# Patient Record
Sex: Male | Born: 1945 | Race: White | Hispanic: No | Marital: Married | Smoking: Former smoker
Health system: Southern US, Community
[De-identification: ages and names within clinical notes are randomized; demographics above are authoritative.]

## PROBLEM LIST (undated history)

## (undated) DIAGNOSIS — E7800 Pure hypercholesterolemia, unspecified: Secondary | ICD-10-CM

## (undated) DIAGNOSIS — E079 Disorder of thyroid, unspecified: Secondary | ICD-10-CM

## (undated) DIAGNOSIS — Z9889 Other specified postprocedural states: Secondary | ICD-10-CM

## (undated) DIAGNOSIS — I10 Essential (primary) hypertension: Secondary | ICD-10-CM

## (undated) DIAGNOSIS — K449 Diaphragmatic hernia without obstruction or gangrene: Secondary | ICD-10-CM

## (undated) DIAGNOSIS — I219 Acute myocardial infarction, unspecified: Secondary | ICD-10-CM

## (undated) HISTORY — PX: ABDOMINAL AORTIC ANEURYSM REPAIR: SUR1152

---

## 2000-07-08 ENCOUNTER — Encounter: Payer: Self-pay | Admitting: *Deleted

## 2000-07-08 ENCOUNTER — Encounter: Admission: RE | Admit: 2000-07-08 | Discharge: 2000-07-08 | Payer: Self-pay | Admitting: *Deleted

## 2007-06-05 ENCOUNTER — Inpatient Hospital Stay (HOSPITAL_COMMUNITY): Admission: EM | Admit: 2007-06-05 | Discharge: 2007-06-14 | Payer: Self-pay | Admitting: Emergency Medicine

## 2007-06-06 ENCOUNTER — Ambulatory Visit: Payer: Self-pay | Admitting: Thoracic Surgery (Cardiothoracic Vascular Surgery)

## 2007-06-06 ENCOUNTER — Encounter (INDEPENDENT_AMBULATORY_CARE_PROVIDER_SITE_OTHER): Payer: Self-pay | Admitting: *Deleted

## 2007-06-08 ENCOUNTER — Encounter: Payer: Self-pay | Admitting: Thoracic Surgery (Cardiothoracic Vascular Surgery)

## 2007-06-09 ENCOUNTER — Encounter: Payer: Self-pay | Admitting: Thoracic Surgery (Cardiothoracic Vascular Surgery)

## 2007-06-22 ENCOUNTER — Ambulatory Visit: Payer: Self-pay | Admitting: Thoracic Surgery (Cardiothoracic Vascular Surgery)

## 2007-07-01 ENCOUNTER — Inpatient Hospital Stay (HOSPITAL_COMMUNITY)
Admission: RE | Admit: 2007-07-01 | Discharge: 2007-07-08 | Payer: Self-pay | Admitting: Thoracic Surgery (Cardiothoracic Vascular Surgery)

## 2007-07-01 ENCOUNTER — Encounter: Payer: Self-pay | Admitting: Thoracic Surgery (Cardiothoracic Vascular Surgery)

## 2007-07-27 ENCOUNTER — Ambulatory Visit: Payer: Self-pay | Admitting: Thoracic Surgery (Cardiothoracic Vascular Surgery)

## 2007-07-27 ENCOUNTER — Encounter
Admission: RE | Admit: 2007-07-27 | Discharge: 2007-07-27 | Payer: Self-pay | Admitting: Thoracic Surgery (Cardiothoracic Vascular Surgery)

## 2007-08-24 ENCOUNTER — Ambulatory Visit: Payer: Self-pay | Admitting: Thoracic Surgery (Cardiothoracic Vascular Surgery)

## 2008-02-29 ENCOUNTER — Ambulatory Visit: Payer: Self-pay | Admitting: Thoracic Surgery (Cardiothoracic Vascular Surgery)

## 2008-03-28 ENCOUNTER — Ambulatory Visit (HOSPITAL_COMMUNITY): Admission: RE | Admit: 2008-03-28 | Discharge: 2008-03-28 | Payer: Self-pay | Admitting: Cardiology

## 2009-05-22 IMAGING — CR DG CHEST 2V SAME DAY
2 series · 2 of 2 positions shown · non-contrast
Comparison: None.

Exam: Chest, 2 views.

HISTORY: Towards her breath.

[w chest pa]
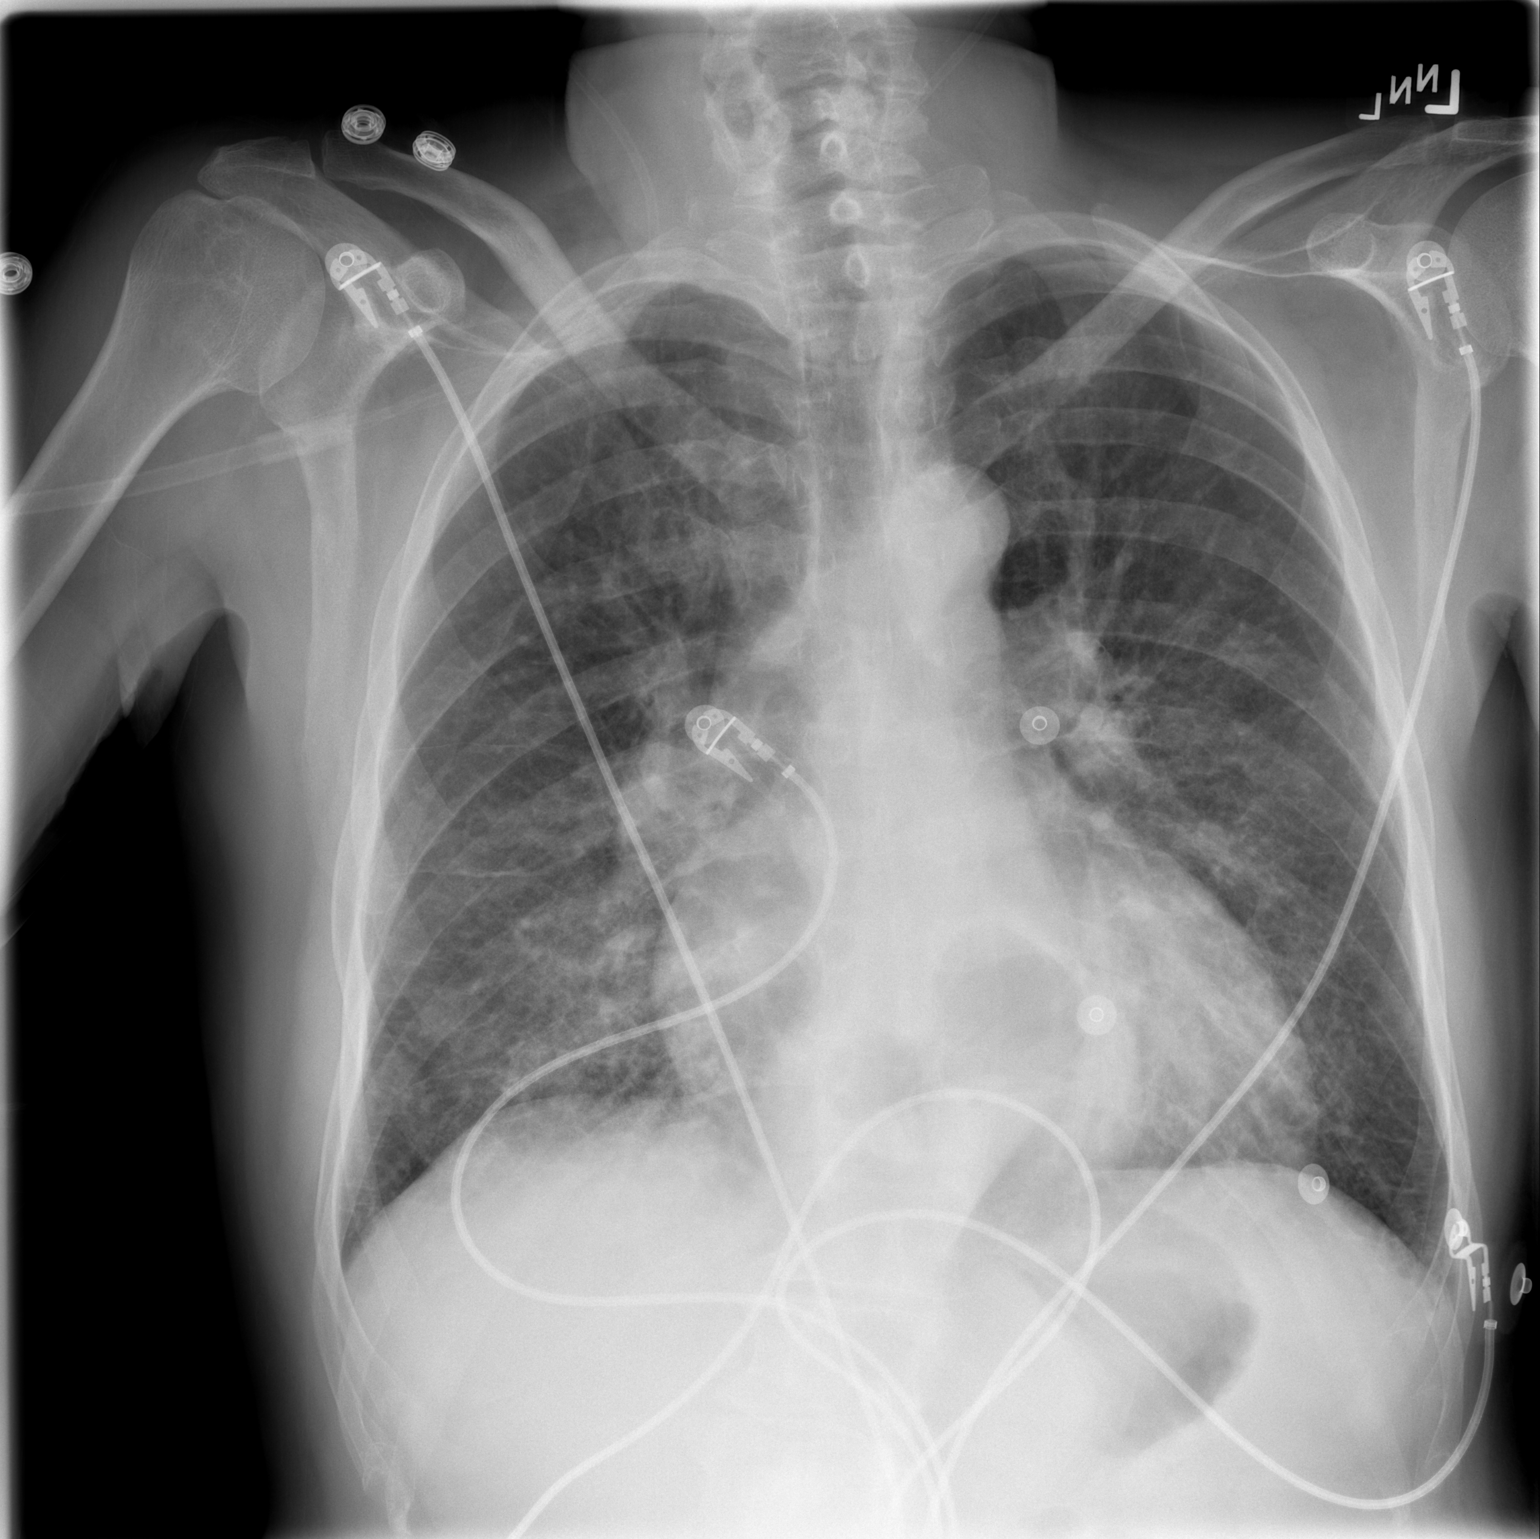

[w chest lat]
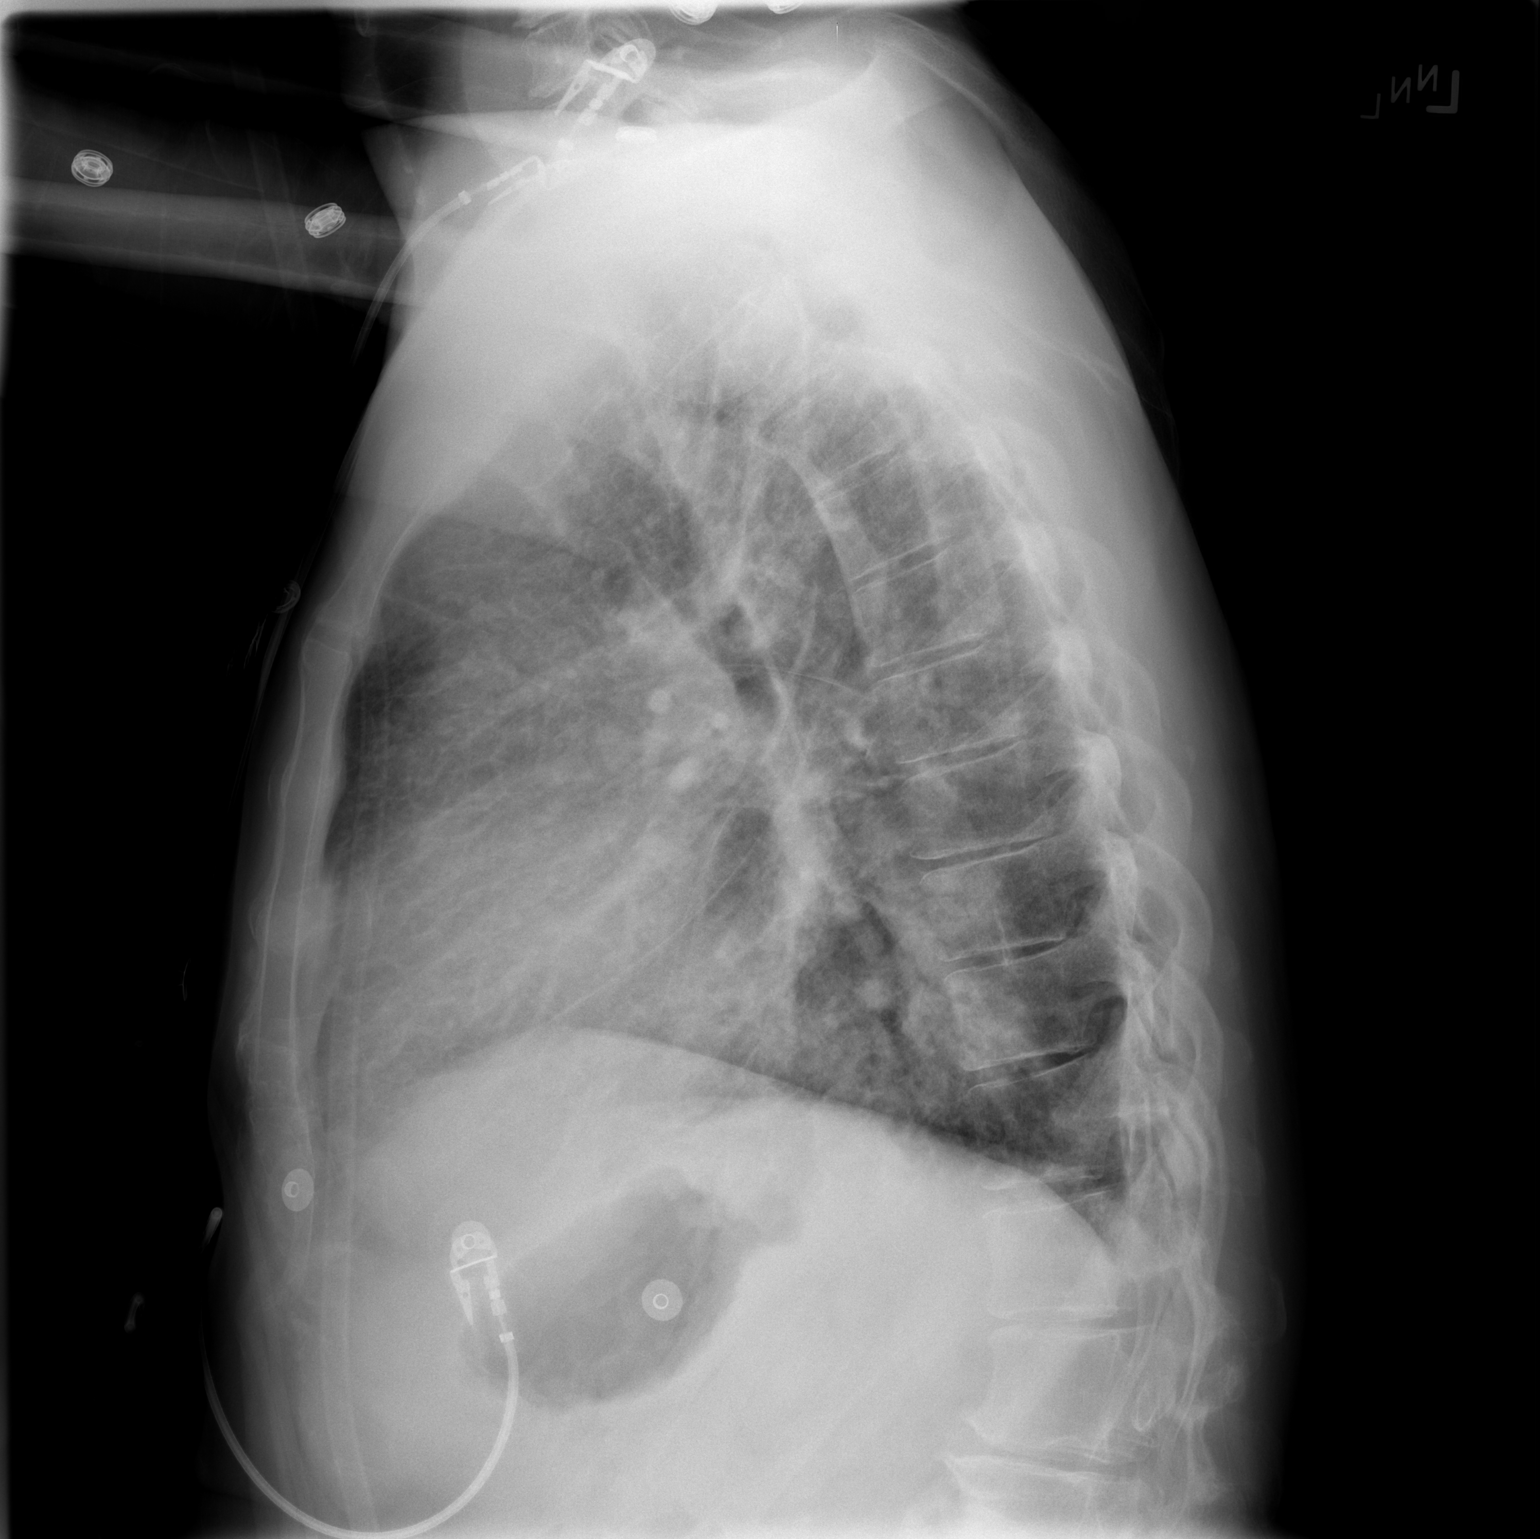

[2 of 2 positions shown; findings below may reference images not displayed]

FINDINGS: Heart size is enlarged.

There is no pleural effusions.

Mild interstitial edema is noted.

Atelectasis is identified at both lung bases.

A large hiatal hernia is identified.
IMPRESSION: 1. Cardiac enlargement and pulmonary edema.
2. Bibasilar atelectasis.
3. Hiatal hernia.

## 2009-05-25 IMAGING — US US ABDOMEN COMPLETE
1 series · 13 of 13 positions shown · non-contrast
Comparison: none

CLINICAL DATA: Anemia.  Assess for cirrhosis. Abnormal liver function tests.
 ABDOMEN ULTRASOUND:
TECHNIQUE: Complete abdominal ultrasound examination was performed including evaluation of the liver, gallbladder, bile ducts, pancreas, kidneys, spleen, IVC, and abdominal aorta.

[Series 1: unknown · 0.32mm/px · 13 of 13 slices shown]
[im 1/13]
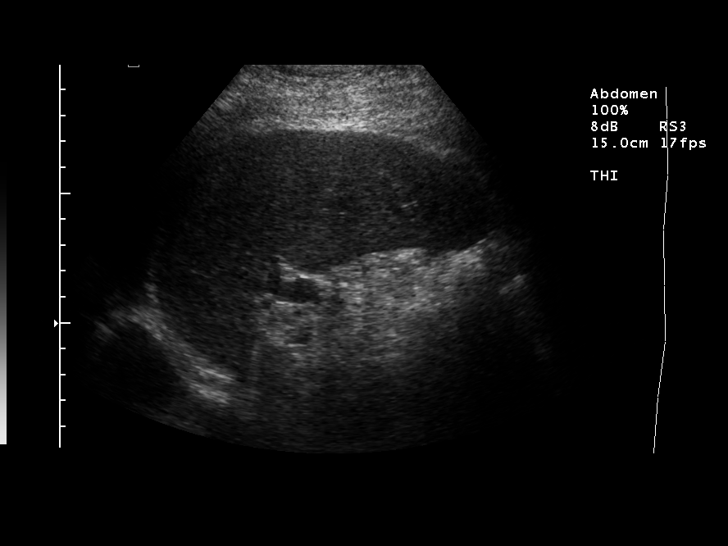
[im 2/13]
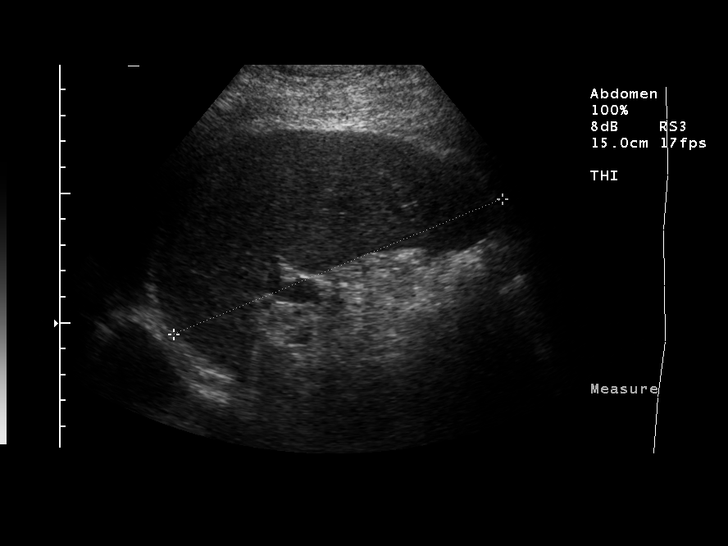
[im 3/13]
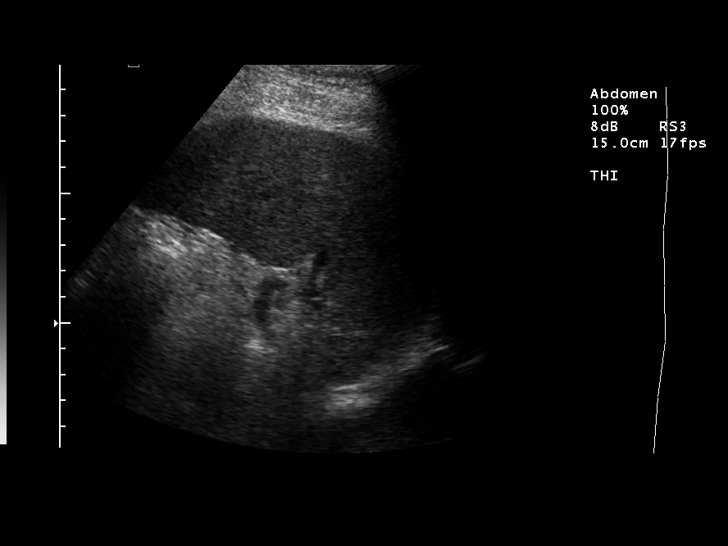
[im 4/13]
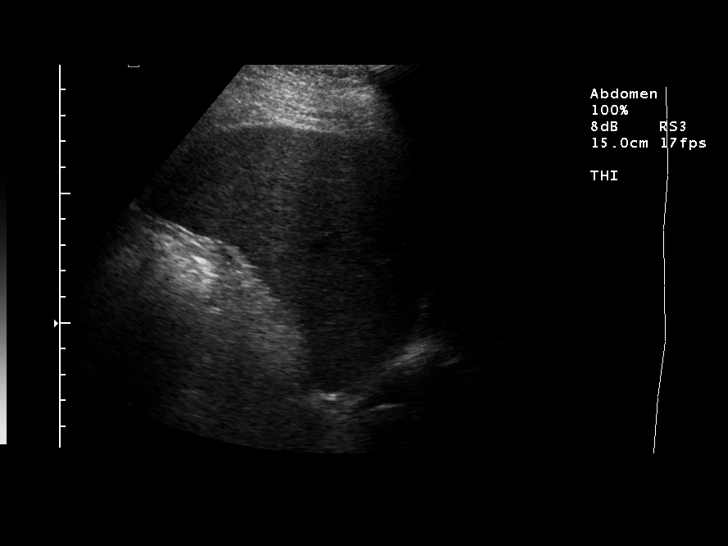
[im 5/13]
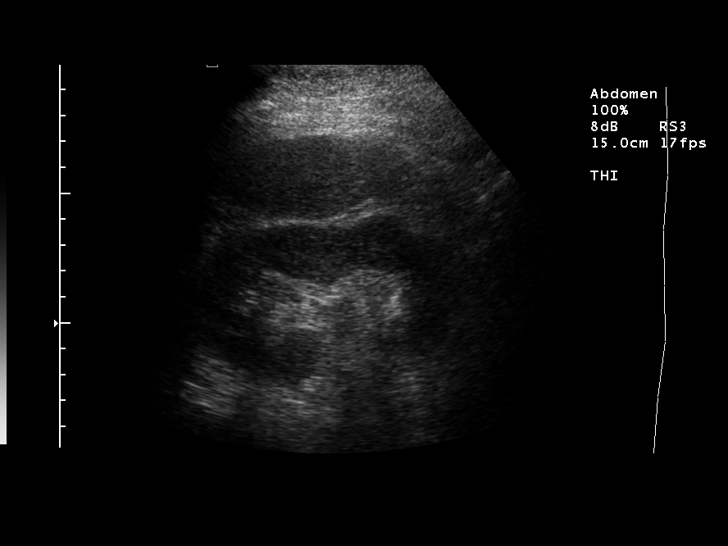
[im 6/13]
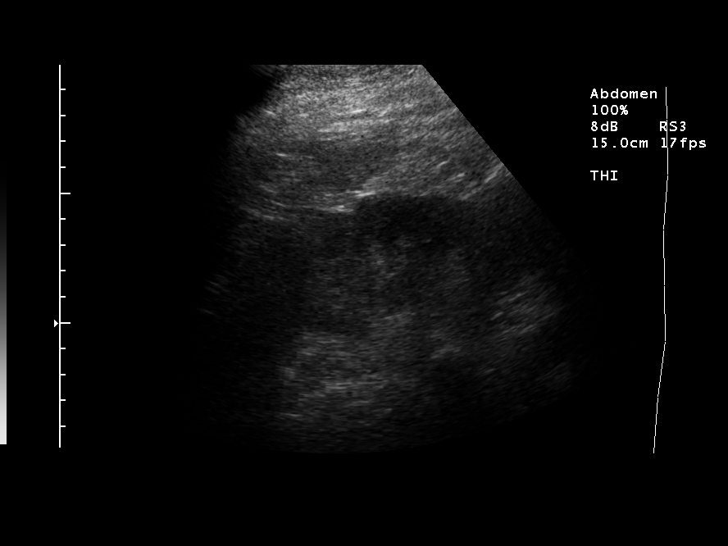
[im 7/13]
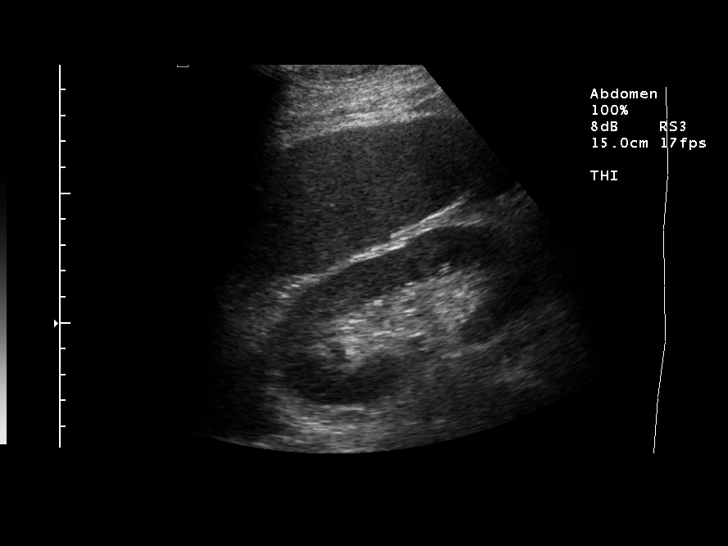
[im 8/13]
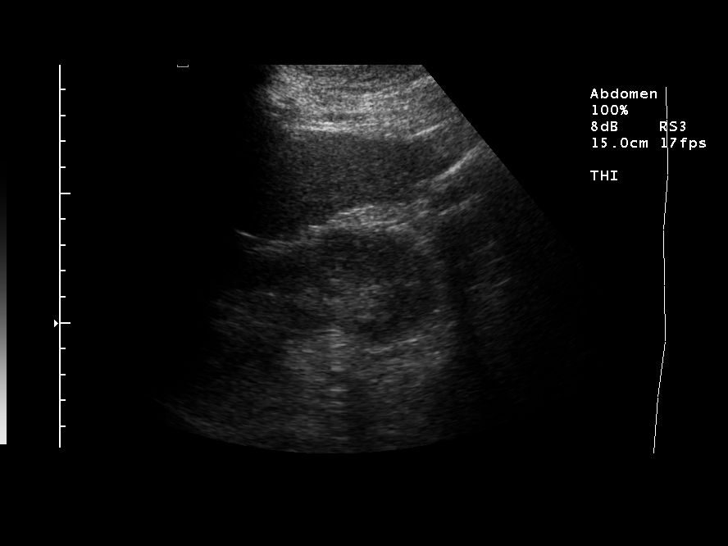
[im 9/13]
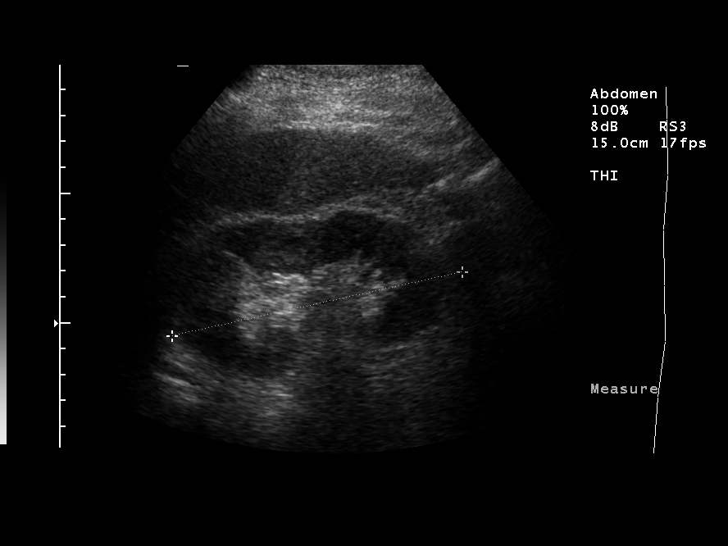
[im 10/13]
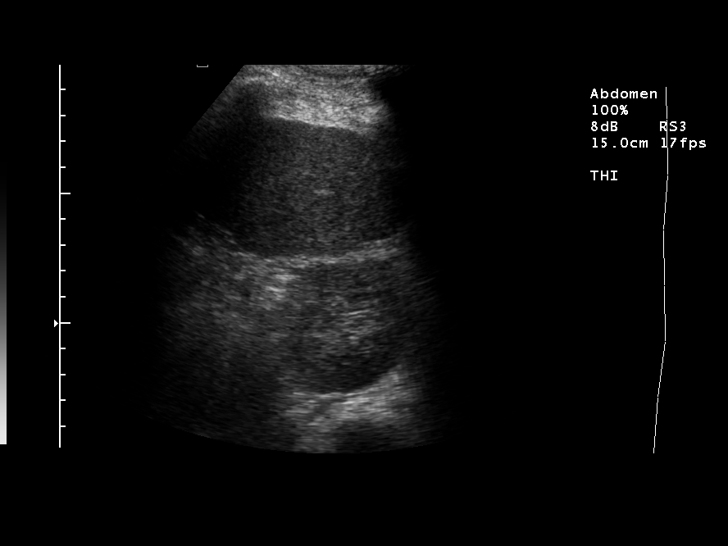
[im 11/13]
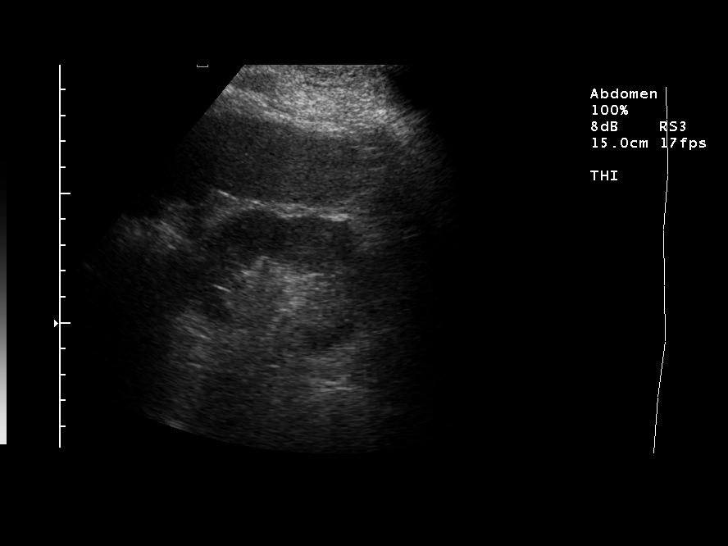
[im 12/13]
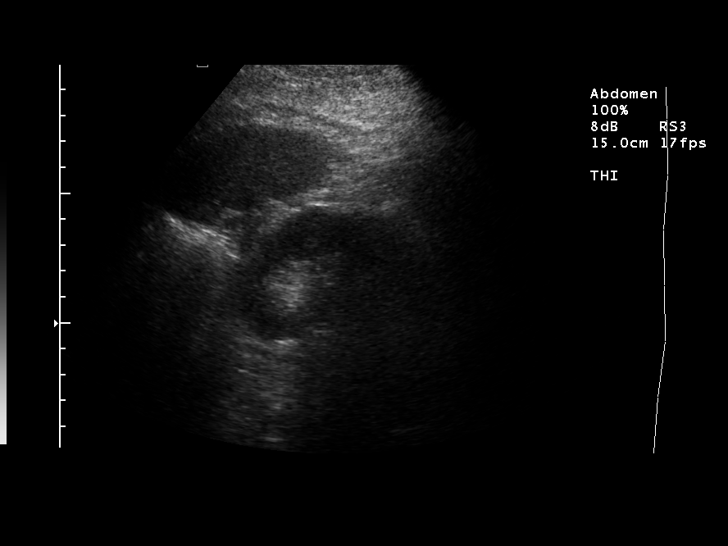
[im 13/13]
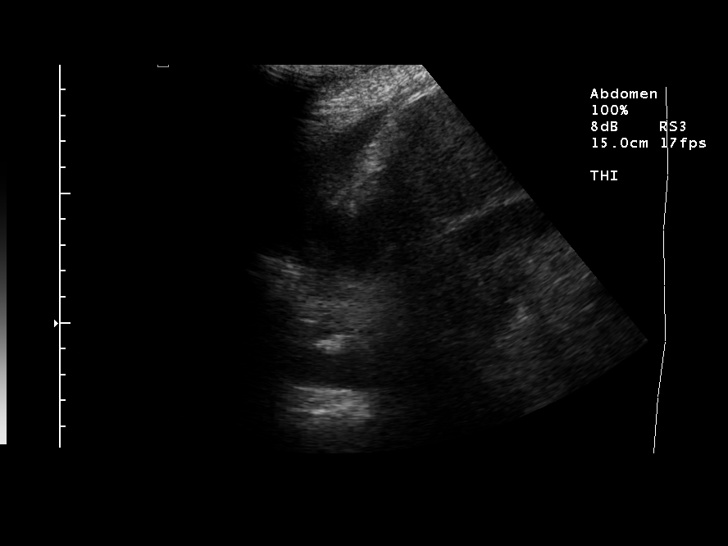

[13 of 13 positions shown; findings below may reference images not displayed]

FINDINGS: The gallbladder is normal without stones or sludge.  The common duct is normal in size at 5 mm.  The spleen is mildly enlarged with a sagittal length of 13.7 cm and a transverse measurement at the hilum of 12 x 6.2 cm.  No focal lesions.  Both kidneys are normal with the right measuring 11.5 cm and the left measuring 11.4 cm.  The aorta shows maximal transverse measurement of 2.6 cm.  There is no ascites.  The patient does have small bilateral pleural effusions.  Blood flow in the portal vein has proper direction.  The hepatic veins are patent.
IMPRESSION: 1. The liver appears normal by sonography.
 2. Mild splenomegaly.
 3. Venous blood flow in normal direction at the present time.

## 2009-06-18 IMAGING — CR DG CHEST 1V PORT
1 series · 1 of 1 positions shown · non-contrast
Comparison: One day prior

CLINICAL DATA: Respiratory distress.

CHEST - 1 VIEW

[view not recorded]
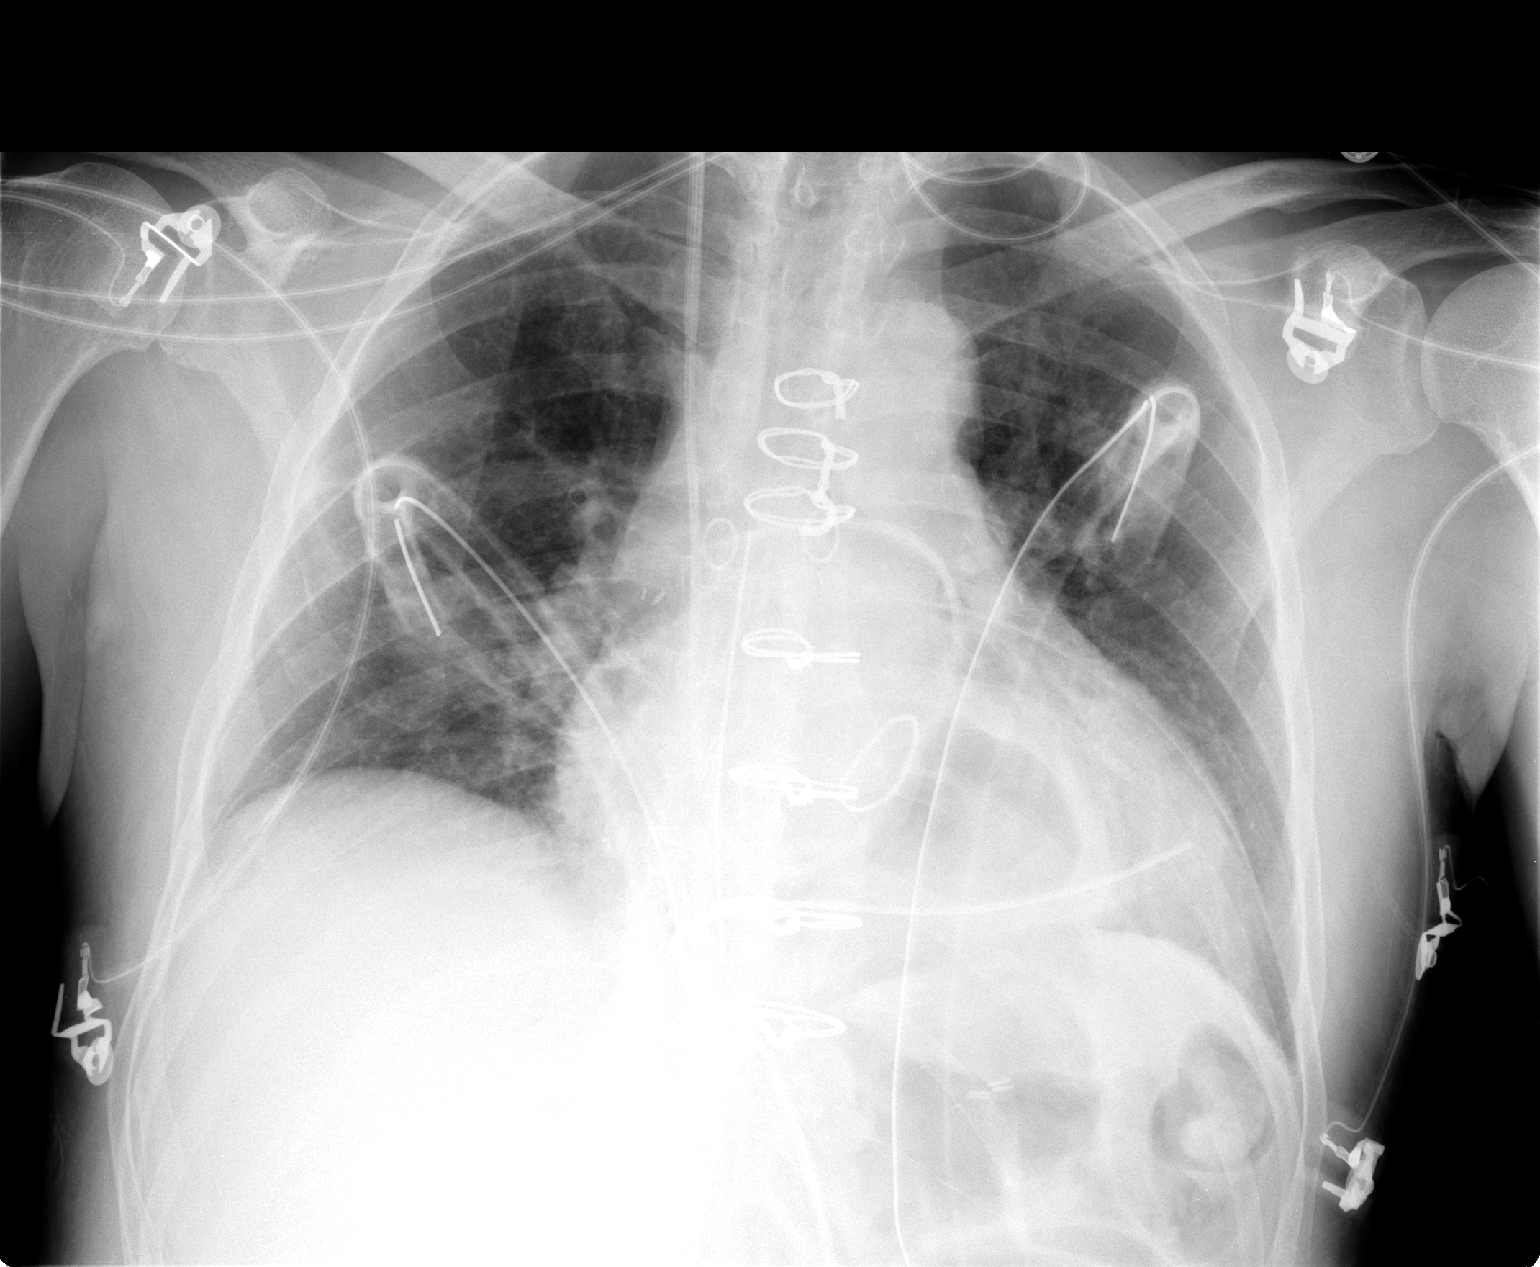

[1 of 1 positions shown; findings below may reference images not displayed]

FINDINGS: Removal of endotracheal and nasogastric tubes. Right IJ Swan-Ganz
catheter unchanged in position. Median sternotomy. Bilateral chest tubes and
mediastinal drain unchanged in position.

Tiny left apical pneumothorax suspected. Minimal layering left-sided pleural
fluid.

Cardiomegaly. Increased patchy atelectasis. A moderate sized hiatal hernia.

IMPRESSION

1. Interval extubation and removal of nasogastric tube. Remainder of support
apparatus appropriately positioned.
2. Tiny left apical pneumothorax suspected with left-sided chest tube in place.
3. Increased patchy atelectasis.

## 2011-02-26 NOTE — Op Note (Signed)
[name], [name] NO.:  [id number]   MEDICAL RECORD NO.:  [id number]          PATIENT TYPE:  INP   LOCATION:  2903                         FACILITY:  [facility]   PHYSICIAN:  Bernette Redbird, M.D.   DATE OF BIRTH:  1946-03-20   DATE OF PROCEDURE:  06/06/2007  DATE OF DISCHARGE:                               OPERATIVE REPORT   PROCEDURE:  Upper endoscopy.   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  He has shown evidence of an acute myocardial infarction  on this admission, characterized by mild bump in cardiac enzymes,  and  associated ST elevation on serial EKGs.  He has a history of significant  aspirin and NSAID exposure.   FINDINGS:  Large hiatal hernia with reflux esophagitis.   PROCEDURE:  The nature, purpose, and risks of the procedure been  discussed with the patient who provided written consent.   SEDATION:  Versed 6 mg IV (no fentanyl).  Following topical pharyngeal  anesthesia with Cetacaine spray.   DESCRIPTION OF PROCEDURE:  The procedure was done at the bedside in the  ICU.   With the patient in left lateral decubitus position, the Pentax video  endoscope was passed under direct vision.  The vocal cords looked  normal.  The esophagus was entered; and had inflammatory changes  beginning at approximately 25 cm.  Proximal to this, the esophagus was  normal; but distally, there were linear patches of eroded mucosa very  consistent with reflux esophagitis, as well as some dimpling of the  mucosa consistent with scarring from previous inflammatory conditions.  There were no deep ulcerations, and no stigma of hemorrhage.  No tumor  or varices were seen.  There may have been some mild esophageal stenosis  or narrowing associated with this inflammatory change; but there was no  resistance to passage of the 10-mm endoscope.   The  gastroesophageal junction was located at between 32 and 35 cm and  below this was a very large hiatal hernia extending down to the  diaphragm at about 43 cm, thus constituting roughly a 7-9 cm in length  hiatal hernia. In addition, the hiatal hernia was very broad in  transverse dimension, probably at least 15 cm.  There was no definite  para-esophageal component, but the hiatal hernia was large enough that  it did appear, that a small portion of it may have actually gone  alongside the esophagus.   The diaphragmatic hiatus was very patulous.   The stomach contained no blood or coffee-grounds material.  The gastric  mucosa was entirely normal despite the patient's history of aspirin and  NSAID exposure.  Specifically no erosions or ulcers were seen; and there  was no evidence of polyps, masses, cancer, or vascular ectasia.  A  retroflexed view confirmed the patulous character of the diaphragmatic  hiatus, as well as the capacious size of the hiatal hernia.  The  pylorus, duodenal bulb, and second duodenum looked normal.   The scope was removed from  the patient.  I elected not to take biopsies  in the event that potent anticoagulation might be needed during a  percutaneous coronary intervention later today, as is being contemplated  by Dr. Sharyn Lull.  The patient tolerated the procedure well and there no  apparent complications.   IMPRESSION:  1. No active bleeding or blood in the stomach.  2. Very large hiatal hernia as described above.  This readily accounts      for the patient's anemia.  3. Erosive reflux esophagitis without stigma of hemorrhage, thus, the      patient is thought to be at low risk for inducible GI bleeding in      the event of anticoagulation.  4. No NSAID gastritis or ulcers.  5. No evidence of malignancy.   PLAN:  1. Okay for antiplatelet therapy or anticoagulation as may be needed      by Dr. Sharyn Lull.  2. Increase PPI therapy.  3. The patient will need  follow up endoscopy to confirm healing of the      esophageal mucosa, plus colonoscopic evaluation, at some point in      the near future, probably about 2 months from now.  4. He would probably benefit from eventual surgical repair of his      hiatal hernia, since this is probably where the anemia came from.           ______________________________  Bernette Redbird, M.D.     RB/MEDQ  D:  06/06/2007  T:  06/06/2007  Job:  [id number]   cc:   Eduardo Osier. Sharyn Lull, M.D.

## 2011-02-26 NOTE — Assessment & Plan Note (Signed)
OFFICE VISIT   [name], [name]  DOB:  [birth year]                                        Feb 29, 2008  CHART #:  [id number]   HISTORY OF PRESENT ILLNESS:  [name] returns for late followup status  post coronary artery bypass grafting x3 and mitral valve repair on  July 01, 2007.  He was last seen here in the office August 24, 2007.  Since then, he has continued to do quite well.  He denies any  shortness of breath.  He denies any significant chest pain.  His  activity level is good, and he feels much better than he did prior to  surgery.  He does report that he still has a funny numb feeling along  the left anterior chest wall.  He has not had any tachy-palpitations.  He has not had any dizzy spells.  He has no symptoms of congestive heart  failure.  The remainder of his review of systems is unrevealing.  The  remainder of his past medical history is unchanged.   CURRENT MEDICATIONS:  1. Amiodarone 2 mg daily.  2. Coumadin as directed.  3. Iron sulfate.  4. Lopressor 50 mg daily.  5. Pepcid 40 mg twice daily.  6. Synthroid 0.05 mg daily.  7. Niacin 500 mg twice daily.  8. Ambien as needed for sleep.  9. Lecithin 200 mg three times daily.  10.Aspirin 81 mg daily.  11.Altace 10 mg daily.  12.Tekturna 150 mg daily.  13.Lasix 20 mg daily.  14.Protonix 40 mg daily.  15.Lipitor 10 mg daily.   PHYSICAL EXAM:  Notable for a well-appearing thin male with blood  pressure 161/110, pulse 88 and regular.  Oxygen saturation is 98% on  room air.  Examination of the chest reveals a well-healed median  sternotomy scar.  The breath sounds are clear to auscultation and  symmetrical bilaterally.  Cardiovascular:  Regular rate and rhythm.  No  murmurs, rubs or gallops are noted.  Abdomen:  Soft and nontender.  Extremities:  Warm and well-perfused.  There is no lower extremity  edema.  The remainder of his physical exam is unrevealing.   IMPRESSION:  [name]  seems to doing quite well.  His blood pressure  remains inadequately controlled.  He has not had any symptoms to suggest  continued ongoing arrhythmias.  He remains on both amiodarone and  Coumadin.   PLAN:  I have instructed [name] to stop taking amiodarone.  I think he  could come off of Coumadin as well, presuming that his rhythm remains  stable.  He plans to see Dr. Sharyn Lull later this month, and I have  suggested that he should make sure that Dr. Sharyn Lull is comfortable  stopping his Coumadin when he sees him at that time.  We have not made  any changes in his blood pressure medications at this time, and he  understands that this needs to be addressed and further adjusted as  needed.  I would recommend a followup echocardiogram at some point as  well.  Overall, he seems to be doing very well with respect to his  previous heart surgery, and he has no residual surgical issues at this  juncture.  Mild numbness and altered sensational along the left anterior  chest wall is typical for patients who have  had recent bypass surgery.  In the future, he will call and return to see [place] here at [facility] only should further problems or difficulties  arise.   [name]. Cornelius Moras, M.D.  Electronically Signed   CHO/MEDQ  D:  02/29/2008  T:  02/29/2008  Job:  [id number]   cc:   Eduardo Osier. Sharyn Lull, M.D.  Ralene Ok, M.D.  Bernette Redbird, M.D.

## 2011-02-26 NOTE — Assessment & Plan Note (Signed)
OFFICE VISIT   [name], [name]  DOB:  1946-06-14                                        August 24, 2007  CHART #:  [id number]   HISTORY OF PRESENT ILLNESS:  The patient returns for further followup  status post coronary artery bypass grafting x3 and mitral valve repair  on 07/01/2007.  He was last seen here in the office by Dr. Dorris Fetch  on 07/27/2007.  Since then, he has continued to do well.  He denies any  shortness of breath, and in fact he notes that his exercise tolerance is  now better with respect to breathing than it was prior to his surgery.  He is not having tachy palpitations or dizzy spells.  He has minimal  soreness in his chest.  Overall, he seems to be getting along quite  well.  His medications remain unchanged from previously, other than the  fact that his dose of Coumadin continues to be adjusted, now 4 mg daily.   PHYSICAL EXAMINATION:  Notable for a well-appearing male, with blood  pressure 144/89, pulse 62, oxygen saturation 97% on room air.  Examination of the chest reveals a median sternotomy incision that is  healing nicely.  The sternum is stable on palpation.  Breath sounds are  clear to auscultation and symmetrical bilaterally.  No wheezes or  rhonchi are demonstrated.  Cardiovascular exam is notable for a regular  rate and rhythm.  No murmurs, rubs, or gallops are noted.  The abdomen  is soft and nontender.  The extremities are warm and well perfused.  The  small incision from endoscopic vein harvest has healed nicely.  There is  no lower extremity edema.   IMPRESSION:  The patient continues to improve, now nearly 2 months  status post coronary artery bypass grafting x3 and a mitral valve repair  for severe 3-vessel coronary artery disease with ischemic mitral  regurgitation.  The patient seems to be doing well.  There are no signs  or symptoms to suggest any further atrial arrhythmias.   PLAN:  I have instructed the  patient to cut his dose of amiodarone back  to 200 mg daily.  He plans to see Dr. Sharyn Lull for further followup in  early 10/2007.  Perhaps at that time he could come off of Coumadin or  amiodarone, depending on how he is progressing.  I have cautioned him to  avoid any heavy lifting or strenuous use of his arms or shoulders.  I do  think he could probably go back to work at some point within the next  month, particularly if he can go back and work limited hours initially  and gradually increase from there.  I have also suggested that he might  benefit from the cardiac rehab program.   [name]. Cornelius Moras, M.D.  Electronically Signed   CHO/MEDQ  D:  08/24/2007  T:  08/25/2007  Job:  [id number]   cc:   Eduardo Osier. Sharyn Lull, M.D.  Bernette Redbird, M.D.

## 2011-02-26 NOTE — Assessment & Plan Note (Signed)
OFFICE VISIT   [name], [name]  DOB:  Dec 07, 1945                                        June 22, 2007  CHART #:  [id number]   HISTORY OF PRESENT ILLNESS:  The patient returns for further follow up  related to recently discovered 3-vessel coronary artery disease, status  post acute myocardial infarction.  He was originally seen in  consultation on 06/06/2007.   The patient originally presented with profound symptomatic anemia with a  hemoglobin of 4.1 and Hemoccult-positive stools.  He was seen in  consultation by Dr. Bernette Redbird and was found to have a giant sliding  hiatal hernia that was felt to be the cause of chronic GI blood loss  anemia.  He was transfused multiple units of blood products.  He did  suffer an acute myocardial infarction and cardiac catheterization  performed by Dr. Sharyn Lull demonstrated 3-vessel coronary artery disease  with moderate left ventricular dysfunction.  The patient stabilized  quite nicely with medical therapy and has done extremely well since  then.  His hemoglobin has remained stable.  He had a transient episode  of paroxysmal atrial fibrillation for which he was started on  amiodarone, although this was very brief and he was discharged home in  sinus rhythm.  He returns to the office for further followup today.   REVIEW OF SYSTEMS:  The patient reports feeling well.  He has not had  any symptoms of chest pain, chest tightness, chest pressure or shortness  of breath.  He has not had any tachy-palpitations.  He is eating well.  He states that he feels better than he has in months, if not longer than  that.  His family notes that he looks terrific compared to how he had  been looking for months prior to his recent hospitalization.  He has not  had any grossly bloody stools. He has no abdominal pain or complaints.  The remainder of his review of systems is unchanged from previously.   PAST MEDICAL HISTORY:  The  remainder of his past medical history is  unchanged.   CURRENT MEDICATIONS:  1. Iron sulfate 325 mg 3 times daily.  2. Lopressor 50 mg once daily.  3. Pepcid 40 mg twice daily.  4. Synthroid 50 mcg once daily.  5. Niacin SR 500 mg daily.  6. Ambien 5 mg at bedtime as needed for sleep.  7. Amiodarone 100 mg twice daily.  8. Megavitamin 1 tablet daily.  9. Lecithin SR 200 mg 3 times daily.  10.Aspirin 81 mg daily.   ALLERGIES:  None.   PHYSICAL EXAMINATION:  General:  The patient is a well-appearing male  who appears his stated age, in no acute distress.  Vital signs:  Blood  pressure 122/80, pulse 70 and regular, oxygen saturation 99% on room  air.  HEENT:  Within normal limits.  Neck:  Supple.  There is no  cervical or supraclavicular lymphadenopathy.  There is no jugular venous  distention, no carotid bruits are noted.  Lungs:  Auscultation of the  chest demonstrates clear breath sounds which are symmetrical  bilaterally.  No wheezes or rhonchi are noted.  Cardiovascular:  Reveals  regular rate and rhythm.  No murmurs, rubs, or gallops are appreciated.  Abdomen:  Soft, nontender.  There are no palpable masses.  Bowel sounds  are present.  Extremities:  Warm and well perfused.  There is no lower  extremity edema.  Distal pulses are palpable in both lower legs at the  ankle.  There is no venous insufficiency.  Rectal/GU:  Exams are both  deferred.   DIAGNOSTIC TESTS:  Complete blood count obtained 06/19/2007 through  [facility] is notable for hemoglobin 11.7, hematocrit  40.9%, white blood count 7100, platelet count 481,000.   IMPRESSION:  Severe 3-vessel coronary artery disease with recent  myocardial infarction that occurred in the setting of profound anemia  related to chronic gastrointestinal blood loss with a large sliding type  hiatal hernia.  The patient remains quite stable at present and his  hemoglobin and hematocrit has continued to increase  following hospital  discharge.  I believe it would make sense to go ahead and proceed with  elective surgical revascularization for definitive treatment of his  underlying coronary artery disease.   PLAN:  I have discussed the indications, risks, and potential benefits  of surgery with the patient here in the office today.  Alternative  treatment  strategies have been discussed.  He understands and accepts  all associated risks of surgery, including, but not limited to, risk of  death, stroke, myocardial infarction, congestive heart failure,  respiratory failure, pneumonia, bleeding requiring blood transfusion,  arrhythmia, infection, and recurrent coronary artery disease.  All of  his questions have been addressed.  We tentatively plan to proceed with  surgery on Wednesday, July 01, 2007.   [name]. Cornelius Moras, M.D.  Electronically Signed   CHO/MEDQ  D:  06/22/2007  T:  06/23/2007  Job:  [id number]   cc:   Bernette Redbird, M.D.  Eduardo Osier. Sharyn Lull, M.D.

## 2011-02-26 NOTE — Consult Note (Signed)
[name], [name] NO.:  [id number]   MEDICAL RECORD NO.:  [id number]          PATIENT TYPE:  INP   LOCATION:  2903                         FACILITY:  [facility]   PHYSICIAN:  [name]. Cornelius Moras, M.D. DATE OF BIRTH:  [birth year]   DATE OF CONSULTATION:  06/06/2007  DATE OF DISCHARGE:                                 CONSULTATION   REQUESTING PHYSICIAN:  Mohan N. Sharyn Lull, M.D.   REASON FOR CONSULTATION:  Severe three-vessel coronary artery disease  with acute myocardial infarction.   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  The patient has not seen a  physician in quite some time; although, in the past he has seen Dr.  Karilyn Cota .  He apparently has known history of hypertension and  hyperlipidemia; although, he has not been regularly on any medications  recently.  The patient and his family state that he had been sort of  slowly declining over quite some time, gradually becoming weak.  The  patient also reports a several-month history of progressive symptoms of  exertional shortness of breath.  He denies ever having any symptoms of  substernal chest pain, chest tightness, chest pressure either with  activity or at rest.  Over the last week he developed severe generalized  weakness, and on the morning of August 22, he developed severe weakness  and shortness of breath.  He presented to the emergency department where  baseline laboratory data were notable for a hemoglobin of 4.1 and  Hemoccult positive stool.  The patient denied any known history of  grossly heme positive stool or hematemesis.  He was admitted to the  hospital and evaluated by Dr. Bernette Redbird.  He was transfused a total  of four units of blood overnight last night and two additional units of  blood today.  He has remained hemodynamically stable throughout.  He has  never had any chest pain.  He underwent upper GI endoscopy  Dr. Matthias Hughs.  He was found to have a very large hiatal hernia with erosive  esophagitis.  There is no sign of ongoing GI bleeding.  Admission lab  work were also notable for abnormal cardiac enzymes, consistent with  acute myocardial infarction.  Electrocardiogram at the time of admission  was notable for ST-segment depression somewhat diffusely across the  precordial leads, and repeat electrocardiogram this morning revealed  some ST-segment elevation in lead III.  The patient was evaluated by Dr.  Sharyn Lull and taken to the cardiac cath lab.  He was found to have three-  vessel coronary artery disease with 100% occlusion of the mid right  coronary artery and left-to-right collateral filling of the distal right  coronary artery and posterior descending coronary artery.  There is also  70% stenosis of the mid left anterior descending coronary artery arising  at takeoff of a large diagonal branch.  There is 70% stenosis of the  left anterior descending coronary artery further distally as well.  The  left circumflex coronary artery is small.  Left ventricular function is  moderately reduced with inferior wall akinesis.  Cardiac surgical  consultation was requested, and no attempt for percutaneous coronary  intervention of the right coronary artery was made.   REVIEW OF SYSTEMS:  At present the patient is alert and oriented and  entirely cooperative.  He denies any symptoms of chest pain, chest  tightness, or chest pressure at all in the recent past.  The patient  denies any shortness of breath presently.  He reports that his appetite  has been marginal, and he has not been eating well for quite some time.  He has lost some weight.  He is unsure how much.  He describes some  stable exertional shortness of breath.  He denies resting shortness of  breath, PND, orthopnea, or lower extremity edema.  He reports occasional  difficulty swallowing, but this does not seem to be much of an issue.  He  has longstanding symptoms of reflux.  He denies any knowledge of  hematochezia, hematemesis, or melena.  The patient does report that, in  the past, he has had occasional hemorrhoids with some small amounts of  bright red blood, although, none recently.  The patient denies arthritis  or arthralgias of significance other than mild arthritis in his back.  The patient denies any transient monocular blindness, transient numbness  or weakness involving either upper or lower extremity.  GENITOURINARY:  Negative.  The patient denies urinary urgency or frequency.  The patient  denies fevers or chills.  The remainder of his review of systems is  unremarkable.   PAST MEDICAL HISTORY:  1. Hypertension.  2. Hyperlipidemia.  3. Tobacco use.  4. Remote history alcohol abuse.   MEDICATIONS PRIOR TO ADMISSION:  1. Aspirin 81 mg daily.  2. Ibuprofen 400 mg twice daily.  3. Excedrin as needed.  4. The patient also states that occasionally he took lisinopril and      Lipitor; although, this seems to have been inconsistently.   DRUG ALLERGIES:  NONE KNOWN.   SOCIAL HISTORY:  The patient is married with three children.  He works  as a Teacher, early years/pre.  He does not have a very active physical lifestyle.   FAMILY HISTORY:  Noncontributory.   PHYSICAL EXAM:  GENERAL:  The patient is thin white male who appears his  stated age, in no acute distress.  HEENT:  Exam is grossly unrevealing.  NECK:  The neck is supple.  There is no cervical or supraclavicular  lymphadenopathy.  There is no jugular venous distention.  No carotid  bruits are noted.  CHEST:  Auscultation of the chest demonstrates clear breath sounds with  a few inspiratory crackles at the lung bases.  No wheezes or rhonchi are  noted.  CARDIOVASCULAR:  Exam reveals regular rate and rhythm.  No murmurs,  rubs, or gallops are noted.  ABDOMEN:  Soft and nontender.  There are no palpable masses.  EXTREMITIES:  Warm and adequately perfused.  There  is no lower extremity  edema.  Distal pulses are palpable in the dorsalis pedis position.  There is no sign of venous insufficiency.  RECTAL/GU:  Rectal and GU exams are both deferred.  NEUROLOGIC:  Examination is grossly nonfocal and symmetrical throughout.   DIAGNOSTIC TESTS:  Cardiac catheterization performed by Dr. Sharyn Lull is  reviewed.  This demonstrates three-vessel coronary artery disease.  There is 70% stenosis of the left anterior descending coronary artery  arising at the takeoff of the diagonal branch.  There is 70% tubular  stenosis of the  left anterior descending coronary artery further towards  the apex.  The diagonal branch is large.  The left circumflex coronary  artery and its branches are very small.  There is 100% occlusion of the  mid right coronary artery with left-to-right collateral filling of the  posterior descending coronary artery.  There is moderate left  ventricular dysfunction with inferior wall akinesis and mild mitral  regurgitation.   IMPRESSION:  Severe three-vessel coronary artery disease status post  acute myocardial infarction.  It remains entirely unclear when [name]  suffered his most recent myocardial infarction, and it is possible he  has had one in the past, given the fact that there is some collateral  filling of the posterior descending coronary artery on cardiac  catheterization.  His current event occurred in the setting of profound  anemia.  He has never had any chest pain at all.  He has no other  symptoms or signs of ongoing myocardial ischemia; although, admission  electrocardiograms and repeat electrocardiograms are notable for  ischemic changes, and his cardiac enzymes were consistent with a  significant myocardial infarction.  However, in the setting of his newly  discovered profound anemia related to presumed longstanding GI bleeding,  I do not feel that [name] would best be treated with emergent surgical  revascularization.   The amount of benefit to proceeding with surgery at  this point is questionable at best given the fact that it is very  unclear how much myocardium in the inferior wall remains viable and  whether or not the GI bleeding issue has been adequately addressed.  If  urgent revascularization is felt to be indicated, I would proceed with  percutaneous coronary intervention of the right coronary artery and  consider surgical revascularization at a delayed point in time down the  road after he recovers from all these events.  Alternatively, if his  ischemia is felt to be more chronic, I would favor simply treating him  medically for the time being and allow him to recover from his event.  I  would transfuse to keep his hemoglobin greater than 10 for the short  term while he is recovering from his myocardial infarction and continue  to monitor closely for signs of ongoing bleeding.  We will continue to  follow along and assist with decision-making process, but I do not feel  that proceeding with surgery at this time would be in [name]'s best  interest.      [name]. Cornelius Moras, M.D.  Electronically Signed     CHO/MEDQ  D:  06/06/2007  T:  06/07/2007  Job:  [id number]   cc:   Eduardo Osier. Sharyn Lull, M.D.  Bernette Redbird, M.D.

## 2011-02-26 NOTE — Op Note (Signed)
NAMELAVONNE, CASS NO.:  [id number]   MEDICAL RECORD NO.:  [id number]          PATIENT TYPE:  INP   LOCATION:  2315                         FACILITY:  [facility]   PHYSICIAN:  [name]. Cornelius Moras, M.D. DATE OF BIRTH:  09-Nov-1945   DATE OF PROCEDURE:  07/01/2007  DATE OF DISCHARGE:                               OPERATIVE REPORT   PREOPERATIVE DIAGNOSIS:  Severe three vessel coronary artery disease  with moderate left ventricular dysfunction.   POSTOPERATIVE DIAGNOSIS:  Severe three vessel coronary artery disease  with moderate left ventricular dysfunction and severe ischemic mitral  regurgitation.   PROCEDURE:  Median sternotomy for coronary artery bypass grafting x3  (left internal mammary artery to distal left anterior descending  coronary artery, saphenous vein graft to first diagonal branch,  saphenous vein graft to posterior descending coronary artery, endoscopic  saphenous vein harvest from right thigh) and mitral valve repair (26 mm  Edwards ETlogix ring annuloplasty).   SURGEON:  [name]. Cornelius Moras, M.D.   ASSISTANT:  Jacklynn Bue, SA   ANESTHESIA:  General.   BRIEF CLINICAL NOTE:  The patient is a 65 year old male who originally  presented in August 2008 with profound generalized weakness and  exertional shortness of breath.  He was admitted through the emergency  department where he was noted to have profound iron deficiency anemia  with a hemoglobin 4.1 and hemoccult positive stool.  There was no sign  of active GI bleeding.  The patient was evaluated by Dr. Bernette Redbird  and underwent upper GI endoscopy.  The patient was found to have a large  hiatal hernia with evidence of erosive gastritis and esophagitis, but no  sign of ongoing GI blood loss.  The patient was transfused multiple  units of blood products with appropriate increase in his hemoglobin and  hematocrit.  He has had no further signs of GI bleeding.  During his  initial presentation, the  patient did develop EKG changes suggestive of  acute myocardial infarction.  He underwent cardiac catheterization by  Dr. Sharyn Lull.  He was found to have severe three vessel coronary artery  disease with moderate left ventricular dysfunction.  A full consultation  has been dictated previously.  The patient recovered from his acute  event and has done remarkably well from a clinical standpoint.  He now  returns for elective surgical revascularization.  The patient has been  counseled at length regarding the indications, risks, and potential  benefits of surgery.  Alternative treatment strategies have been  discussed.  He understands and accepts all potential associated risks of  surgery and desires to proceed as described.   OPERATIVE FINDINGS:  1. Moderate left ventricular dysfunction with inferior and posterior      basal wall akinesis, ejection fraction estimated 30%.  2. Severe (3+) mitral regurgitation, functional type III-B.  3. Good quality left internal mammary artery and saphenous vein      conduit for grafting.  4. Good quality target vessels for grafting.  5. No residual mitral regurgitation following mitral valve repair.   OPERATIVE NOTE IN DETAIL:  The patient is  brought to the operating room  on the above mentioned date and central monitoring was established by  the anesthesia service under the care and direction of Dr. Adonis Huguenin.  Specifically, a Swan-Ganz catheter is placed through the right internal  jugular approach.  A radial arterial line is placed.  Intravenous  antibiotics were administered.  Following induction with general  endotracheal anesthesia, a Foley catheter is placed.  The patient's  chest, abdomen, both groins, and both lower extremities are prepared and  draped in a sterile manner.   Baseline pulmonary artery pressures were notably moderately elevated.  Baseline transesophageal echocardiogram was performed by Dr. Krista Blue.  This demonstrates moderate  left ventricular dysfunction with ejection  fraction estimated at 30%.  There is akinesis of the high inferior wall  and posterolateral wall.  There is severe (3+) mitral regurgitation with  classical functional type III-B regurgitation with a broad central jet  that fills the majority of the left atrium.  There is no flow reversal  in the pulmonary veins.  No other abnormalities are noted.   A median sternotomy incision is performed and the left internal mammary  artery is dissected from the chest wall and prepared for bypass  grafting.  The left internal mammary artery is notably a good quality  conduit.  Simultaneously, saphenous vein was obtained from the patient's  right thigh using endoscopic vein harvest technique.  The saphenous vein  is notably a good quality conduit.  After the saphenous vein is removed,  the small incision in the right lower extremity is closed in multiple  layers with running absorbable suture.  The patient is heparinized  systemically and the left internal mammary artery transected distally.  The left internal mammary artery is noted to have excellent flow.   The pericardium was opened.  The ascending aorta is normal in  appearance.  The ascending aorta is cannulated for cardiopulmonary  bypass.  A venous cannula is placed directly in the superior vena cava.  A second venous cannula is placed low in the right atrium with the tip  extending down the inferior vena cava.  A retrograde cardioplegia  catheter is placed through the right atrium into the coronary sinus.  Adequate heparinization is verified.  Cardiopulmonary bypass is begun  and the surface of the heart was inspected.  There is scarring in the  inferior wall consistent with transmural myocardial infarction.  Distal  target sites are selected for coronary bypass grafting.  A temperature  probe is placed in the left ventricular septum.  A cardioplegic catheter  is placed in the ascending aorta.    The patient is allowed to cool passively to 32 degrees systemic  temperature.  The aortic crossclamp was applied and cold blood  cardioplegia is administered initially in antegrade fashion through the  aortic root.  Iced saline slush was applied for topical hypothermia.  The initial cardioplegic arrest and myocardial cooling are felt to be  excellent.  Supplemental cardioplegia is administered retrograde through  the coronary sinus catheter.  Repeat doses of cardioplegia are  administered intermittently throughout the crossclamp portion of the  operation through the aortic root, down subsequently placed vein grafts,  and retrograde through the coronary sinus catheter to maintain left  ventricular septal temperature below 15 degrees centigrade.   The following distal coronary anastomoses were performed:  1. The posterior descending coronary artery is grafted with a      saphenous vein graft in an end-to-side fashion.  This vessel  measures 2 mm in diameter and is a good quality target vessel for      grafting.  2. The first diagonal branch of the left anterior descending coronary      artery is grafted with a saphenous vein graft in an end-to-side      fashion.  This vessel measures 2 mm in diameter and is a good      quality target vessel for grafting.  3. The distal left anterior descending coronary artery is grafted with      the left internal mammary artery in end-to-side fashion.  This      vessel measures 2 mm in diameter and is a good quality target      vessel for grafting.   The left atriotomy incision is performed posteriorly through the  interatrial groove.  The mitral valve was exposed using a self-retaining  retractor.  Exposure is felt to be excellent.  The mitral valve was  carefully examined.  The mitral valve leaflets were normal in  appearance.  The subvalvular apparatus appears normal.  There appears to  be classical functional restriction of the posterior leaflet  as the  primary source of mitral regurgitation with perhaps some additional  annular dilatation.   Ring annuloplasty is performed using interrupted 2-0 Ethibond horizontal  mattress sutures placed circumferentially around the entire mitral  annulus.  Annuloplasty is performed using an [employer] ETlogic annuloplasty ring (model number 4100, serial  number [id number]).  The annuloplasty ring is secured in place.  The valve  was inspected and tested by instilling iced saline into the left  ventricular chamber.  There is no sign of any residual mitral  regurgitation.   Rewarming is begun.  The left atrial appendage was oversewn from within  the left atrium using a two layer closure of running 3-0 Prolene suture.  The left atriotomy incision is closed using a two layer closure of  running 3-0 Prolene suture.  Both proximal saphenous vein anastomoses  were performed directly to the ascending aorta prior to removal of the  aortic crossclamp.  The left ventricular septal temperature rises  rapidly with reperfusion of the left internal mammary artery.  One final  dose of warm retrograde hot shot cardioplegia is administered.  The  aortic crossclamp was removed after a total crossclamp time of 99  minutes.   The heart began to beat spontaneously without need for cardioversion.  All proximal and distal coronary anastomoses were inspected for  hemostasis and appropriate graft orientation.  Epicardial pacing wires  were affixed to the right ventricular outflow tract into the right  atrial appendage.  The patient is rewarmed to 37 degrees centigrade  temperature.  The IVC cannula is removed and its cannulation site  oversewn with Prolene suture.   The patient is weaned from cardiopulmonary bypass without difficulty.  The patient's rhythm at separation from bypass is second degree AV  block.  AV sequential pacing is employed.  Normal sinus rhythm resumes  spontaneously  following separation from bypass and atrial pacing is  employed.  Total cardiopulmonary bypass time for the operation is 126  minutes.  The patient is weaned from bypass on dopamine at 3 mcg/kg per  minute.   Follow up transesophageal echocardiogram performed by Dr. Krista Blue after  separation from bypass demonstrates preserved left ventricular function  with perhaps some improvement and overall ejection fraction estimated  40%.  There is a well seated mitral annuloplasty ring.  There is  no sign  of any residual mitral regurgitation.  There is no significant residual  air.   The venous and arterial cannulae are both removed uneventfully.  Protamine is administered to reverse anticoagulation.  The mediastinum  and left chest are irrigated with saline solution containing vancomycin.  Meticulous surgical hemostasis is ascertained.  The mediastinum and both  pleural spaces were drained using four chest tubes exited through  separate stab incisions inferiorly.  The pericardium and soft tissues  anterior to the aorta are reapproximated loosely.  The On-Q continuous  pain management system is utilized to facilitate postoperative pain  control.  Two 10 inch catheters supplied with the On-Q kit are tunneled  into the deep subcutaneous tissues and positioned just lateral to the  lateral border of the sternum on either side.  Each catheter is flushed  with 5 mL of 0.5% bupivacaine solution and ultimately connected to a  continuous infusion pump.  The sternum was closed with double strength  sternal wire.  The soft tissues anterior to the sternum are closed in  multiple layers and the skin is closed with running subcuticular skin  closure.   The patient tolerated the procedure well and was transported to the  surgical intensive care unit in stable condition.  There were no  intraoperative complications.  All sponge, instrument and needle counts  were verified correct at the completion of the  operation.  No blood  products were administered.      [name]. Cornelius Moras, M.D.  Electronically Signed     CHO/MEDQ  D:  07/01/2007  T:  07/01/2007  Job:  [id number]   cc:   Eduardo Osier. Sharyn Lull, M.D.  Bernette Redbird, M.D.

## 2011-02-26 NOTE — H&P (Signed)
[name], [name] NO.:  [id number]   MEDICAL RECORD NO.:  [id number]          PATIENT TYPE:  EMS   LOCATION:  MAJO                         FACILITY:  [facility]   PHYSICIAN:  Hettie Holstein, D.O.    DATE OF BIRTH:  12/14/1945   DATE OF ADMISSION:  06/05/2007  DATE OF DISCHARGE:                              HISTORY & PHYSICAL   PRIMARY CARE PHYSICIAN:  Unassigned.  Formerly saw Dr. Karilyn Cota.   CHIEF COMPLAINT:  Profound weakness and shortness of breath.   HISTORY OF PRESENT ILLNESS:  [name] is a very pleasant 65 year old  male with unremarkable medical history per his account.  He does have  limited continuity with healthcare.  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  He stated that this became  much worse today when he attempted to go to work and could barely make  it into the building, having to use a shopping cart and taking baby  steps for this profound shortness of breath.  Therefore he presented to  the emergency department via private vehicle at which time he was  discovered to have a hemoglobin of 4.1 and hemoccult positive stools per  the emergency department physician.   Upon further questioning, he cannot recall any grossly bloody stools.  He does recount some small dark stools for several days.  On a routine  basis he takes ibuprofen 400 mg p.o. b.i.d. and a baby aspirin every  day.  He took an additional aspirin today.  In any event, he does report  having a colonoscopy approximately 8-10 years ago.  He believes this was  Dr. Kinnie Scales.  I attempted to contact Dr. Kinnie Scales in the emergency  department and I was unsuccessful and I am requesting the assistance of  [facility] gastroenterology at this time.  [name] is hemodynamically stable  at this time.  He is currently receiving a unit of packed RBCs as  initiated by the emergency department.   PAST MEDICAL HISTORY:  1. Significant for hypertension though no follow-up  and no      medications.  2. No surgeries that he can recall with the exceptions of some nasal      reconstructive surgery.  3. He had a colonoscopy approximately 8-10 years ago, report is normal      by him.  He stated that it done by who he believes is Dr. Kinnie Scales.  4. He does have attention deficit disorder and sees a psychiatrist for      management of this.   MEDICATIONS:  1. Dexedrine p.r.n.  2. Ibuprofen 400 mg p.o. b.i.d.  3. Aspirin 81 mg daily.   ALLERGIES:  NO KNOWN DRUG ALLERGIES.   SOCIAL HISTORY:  He is married.  Only very occasional cigarette.  He is  married with three children.  He works as a Teacher, early years/pre.  He has a  history of alcohol abuse in the past but has not had a routine use for  the past couple of years.   FAMILY HISTORY:  Both of his parents  are alive.  His mother is 37, has a  previous history of breast cancer and father is 46 with some kidney  problems but though all occurring at an advanced age.   REVIEW OF SYSTEMS:  He states that his weight has been normal in the 155  range.  She is appetite, however, has been poor lately.  He takes  ibuprofen for low back pain.  Denies any swelling of his lower  extremities and otherwise further review of systems is unremarkable.   PHYSICAL EXAMINATION:  VITAL SIGNS:  His blood pressure is 124/80, heart  rate 98, respirations 20, O2 saturation 99% on room air.  Temperature is  97.4.  HEENT:  Reveals head to be normocephalic, atraumatic.  Extraocular  muscles intact.  NECK:  Supple, nontender.  No palpable thyromegaly or mass.  CARDIOVASCULAR:  Normal S1-S2.  LUNGS:  Clear to auscultation bilaterally.  ABDOMEN:  Soft and nontender.  No rebound or guarding.  No palpable  mass.  LOWER EXTREMITIES:  No edema.  There is no cyanosis or clubbing.  GENERAL APPEARANCE:  He does appear quite pale.  He does have cause of  chemo pallor.  RECTAL:  Examination performed by the emergency department physician  revealing  hemoccult positive stools.   LABORATORY DATA:  Sodium 137, potassium 4.2, BUN 26, creatinine 1.38 and  glucose 109.  A wbc of 13.6, hemoglobin __________, platelet count of  276, MCV of 49.   ASSESSMENT:  1. Profound symptomatic anemia.  2. Hemoccult positive stools of probable chronic gastrointestinal      blood loss.  3. History of hypertension.  4. Poor dentition.  5. Attention deficit disorder.   PLAN:  At this time will obtain an EKG or locate the one that was  performed in the emergency department.  Transfuse to [name], follow  him in a step-down ICU setting, provide IV fluids and follow his H&H.  Will ask Dr. Kinnie Scales of [facility] to assess to [name],  provide further diagnostic workup.      Hettie Holstein, D.O.  Electronically Signed     ESS/MEDQ  D:  06/05/2007  T:  06/06/2007  Job:  [id number]   cc:   Griffith Citron, M.D.  Bernette Redbird, M.D.

## 2011-02-26 NOTE — Consult Note (Signed)
NAMEABDIAZIZ, KLAHN NO.:  [id number]   MEDICAL RECORD NO.:  [id number]          PATIENT TYPE:  INP   LOCATION:  1833                         FACILITY:  [facility]   PHYSICIAN:  Bernette Redbird, M.D.   DATE OF BIRTH:  1945/10/30   DATE OF CONSULTATION:  06/05/2007  DATE OF DISCHARGE:                                 CONSULTATION   Dr. 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   Mr. Fina was admitted through the emergency room this afternoon because  of significant dyspnea and weakness, and was found to have profound  microcytic anemia with a hemoglobin of 4.1 and an MCV of 49.  Of note,  he is on daily baby aspirin and has had long-term use of ibuprofen twice  daily, primarily for back pain.  He has no known prior history of a  bleeding ulcer and he underwent a colonoscopy by Griffith Citron,  M.D., about 8-10 years ago, apparently without any significant findings.  There is no worrisome family history of colorectal neoplasia or other GI  disorders.   The patient does not have any localizing GI symptoms, although over the  past several years he has lost about 25-30 pounds, a lot of which he  attributes to poor dentition and dental problems making it difficult.  More recently, he has actually had a loss of appetite.  There was even  some transient nausea the other day, but by and large, he has not had  any ongoing GI tract symptomatology.   PAST MEDICAL HISTORY:  No known allergies.   OUTPATIENT MEDICATIONS:  1. Aspirin 81 mg daily.  2. Ibuprofen 400 b.i.d.  3. Dexedrine for ADD which I think he is using on a p.r.n. basis.   MEDICAL ILLNESSES:  1. History of hypertension.  2. No known coronary disease, COPD or diabetes.   HABITS:  Occasional cigarettes, essentially nondrinker.   FAMILY HISTORY:  Negative for GI illnesses.   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  He has recently been splitting his time between [place],  [place], and [place], [place] and it has been quite  stressful for him.  On top of that, his wife and children live in  [place], [place], so he has been doing a lot of driving back  and forth.   REVIEW OF SYSTEMS:  Negative for ongoing upper or lower GI tract  symptoms except for the weight loss noted above and the recent loss of  appetite.  He has had significant weakness and fatigue that has been  going on a long time, but really seemed to come to a head over the past  week or so.   PHYSICAL EXAMINATION:  Physical exam pertinent for heme-positive stool  reported by the ER staff.  This is a very pale and somewhat thin, but  not frankly cachectic Caucasian male in no acute distress.  Systolic  pressure is about 110.  He is just  starting to receive some blood.  He  is very pale in appearance as expected.  He is anicteric.  The chest is  clear to auscultation.  The heart is normal with a rate of about 80.  The abdomen is without any discernible organomegaly, guarding, mass or  tenderness.   LABORATORY DATA:  Hemoglobin 4.1 with MCV 49, white count 13,600 with 95  polys and 4 lymphs, platelets 276,000, RDW markedly elevated at 23.5.  Chemistry panel pertinent for bicarb 18, BUN 26, creatinine 1.4.  Liver  chemistries normal except for an AST of 82.  CK total was elevated 523  with an MB fraction of 60 and troponins are up at 4.5.   EKG shows some LVH with repolarization abnormality, no ST-segment  elevation but rather ST-segment depression.   IMPRESSION:  1. Heme-positive stool in the setting of significant      aspirin/nonsteroidal anti-inflammatory drug exposure.  2. Profound microcytic anemia.  3. Elevated cardiac enzymes of uncertain clinical significance in this      setting.  4. Weight loss.   RECOMMENDATIONS:  1. The first step  is transfusion which is being undertaken.  2. The patient needs empiric antipeptic therapy in view of his recent      aspirin exposure.  3. I would favor early endoscopic evaluation.  The patient does not      show symptoms or manifestations of cardiac instability despite the      elevated enzymes and I think that trying to ascertain the status of      his upper tract is important given his weight loss and heme-      positive stool, even though there is certainly a chronic element to      his anemia with the severe microcytosis.  The nature, purpose and      risks of endoscopy were reviewed with the patient and he is      agreeable.  If the endoscopy is negative, the patient will need      colonoscopy and in fact he will probably need colonoscopy      regardless, but if possible I would prefer to defer that until he      is physically stronger so that he can undergo the prep more easily.   I appreciate the opportunity to have seen this patient in consultation  with you.           ______________________________  Bernette Redbird, M.D.     RB/MEDQ  D:  06/05/2007  T:  06/06/2007  Job:  [id number]

## 2011-02-26 NOTE — Discharge Summary (Signed)
[name], TAGLIAFERRO NO.:  [id number]   MEDICAL RECORD NO.:  [id number]          PATIENT TYPE:  INP   LOCATION:  2027                         FACILITY:  [facility]   PHYSICIAN:  Herbie Saxon, MDDATE OF BIRTH:  October 29, 1945   DATE OF ADMISSION:  06/05/2007  DATE OF DISCHARGE:  06/14/2007                               DISCHARGE SUMMARY   DISCHARGE DIAGNOSES:  1. Non-ST elevation myocardial infarction.  2. Three vessel coronary artery disease.  3. Hypothyroidism.  4. Hyperlipidemia.  5. Erosive esophagitis.  6. Hiatal hernia.  7. Iron-deficiency anemia.  8. Gastrointestinal bleed.  9. Right forearm cellulitis, improved.  10.Atrial fibrillation .  11.Splenomegaly.  12.Generalized anxiety.  13.Small bilateral pleural effusions.  14.History of benign prostatic hypertrophy.  15.Acute coronary syndrome.   CONSULTATIONS:  1. [name]. Cornelius Moras, M.D., cardiology.  2. Bernette Redbird, M.D., gastroenterology.  3. Eduardo Osier. Sharyn Lull, M.D., interventional cardiology.   HOSPITAL COURSE:  This 65 year old Caucasian gentleman presented to the  emergency room with difficulty breathing and was found to be profoundly  anemic with heme positive stools.  Gastroenterologist was consulted and  he did have an endoscopy performed by Dr. Matthias Hughs.  And, the endoscopy  showed no active bleeding, very large hiatal hernia with erosive reflux  esophagitis from June 06, 2007.  The patient was also seen by cardiologist and he was noted to have an  elevated cardiac enzymes.  The reason for the cardiology consult was  because he had ST segment depression that was widespread.  Dr. Sharyn Lull  did a cardiac catheterization and found 3-vessel coronary artery disease  with 100% occlusion of the mid right coronary artery and left-to-right  collateral filling of the distal right coronary artery and posterior  descending coronary artery.  The patient does have a history of tobacco  abuse, weight  loss.  The cardiac catheterization was done on June 06, 2007.  It was recommended he have a CABG two weeks after this discharge.  The patient did develop atrial fibrillation on this admission and the  cardiologist place him on amiodarone.  It was noted he developed right forearm cellulitis and he was started on  Keflex for this and it is improving.  His leukocytosis is resolving.  Beta-blocker and ACE inhibitor were added to the medication treatment.  He has been counseled on tobacco cessation.  He was started on Niaspan  for his hypertriglyceridemia.  Carotid Doppler showed 40-60% right  internal carotid artery stenosis.  The patient was started initially on  heparin infusion, however, the cardiologist believes he is not going to  need Coumadin on a long term basis.   DISCHARGE CONDITION:  Stable.   DIET:  Cardiac, low cholesterol.   ACTIVITY:  To be increased very slowly over the next 2 weeks as he is  coming back to be admitted for the CABG procedure.   FOLLOWUP:  1. Primary care physician, Dr. Ludwig Clarks, in one week.  The primary care      physician will arrange endocrinology followup for the newly      discovered hypothyroidism.  2. Dr.  Barry Dienes, cardiology, in 2 weeks.  3. Dr. Matthias Hughs, gastroenterologist in 4-6 weeks.   DISPOSITION:  Home.   DISCHARGE MEDICATIONS:  1. Enteric coated aspirin 81 mg daily.  2. Toprol XL 25 mg daily.  3. Amiodarone 20 mg b.i.d. one week, then 20 mg daily.  4. Altace 5 mg p.o. daily.  5. Niaspan 500 mg q.h.s.  6. Percocet 5/325 mg q.6 h. p.r.n.  7. Nitroglycerin 0.4 mg sublingual p.r.n. for chest pain.  8. Iron sulfate 325 mg b.i.d.  9. Synthroid 50 mcg daily.  10.Protonix 40 mg b.i.d.  11.Ambien 5 mg q.h.s.   PHYSICAL EXAMINATION:  GENERAL:  He is an elderly man in no acute  distress, clinically pale.  He is not jaundiced.  VITAL SIGNS:  The temperature is 98, pulse is 67, respiratory rate is  18, blood pressure 133/82.  HEART:  Sounds  one and two irregular.  CHEST:  Clinically clear.  ABDOMEN:  Soft, nontender, no organomegaly.  NEUROLOGIC:  He is alert and oriented  x3.  Cranial nerves I-XII are  intact.  EXTREMITIES:  Peripheral pulses present.  No pedal edema.  Right forearm  swelling and tenderness is markedly reduced.   LABORATORY:  The complete blood count shows a WBC of 9, hematocrit 32,  platelet count is 276.  PSA is 0.8.  Chemistry shows a sodium of 139,  potassium 4.1, chloride 109, bicarbonate 23, glucose 99, BUN 15,  creatinine 0.88.   RADIOLOGY:  The abdominal ultrasound of June 08, 2007, showed mild  splenomegaly.  Chest x-ray of June 05, 2007, on admission, shows mild  interstitial edema.  No pleural effusion.   discharge greater than 30 minutes.      Herbie Saxon, MD  Electronically Signed     MIO/MEDQ  D:  06/14/2007  T:  06/14/2007  Job:  [id number]

## 2011-02-26 NOTE — Discharge Summary (Signed)
Cesar, [name] NO.:  [id number]   MEDICAL RECORD NO.:  [id number]          PATIENT TYPE:  INP   LOCATION:  2018                         FACILITY:  [facility]   PHYSICIAN:  [name]. Cornelius Moras, M.D. DATE OF BIRTH:  Aug 14, 1946   DATE OF ADMISSION:  07/01/2007  DATE OF DISCHARGE:                               DISCHARGE SUMMARY   FINAL DIAGNOSES:  Severe three-vessel coronary artery disease, with  moderate left ventricular dysfunction and severe ischemic mitral  regurgitation.   IN-HOSPITAL DIAGNOSES:  1. Postoperative atrial fibrillation.  2. Left forearm phlebitis postoperatively.  3. Acute blood loss anemia postoperatively.  4. Volume overload postoperatively.   SECONDARY DIAGNOSES:  1. History of his paroxysmal atrial fibrillation preoperatively      following acute myocardial infarction in August 2008.  2. History of severe anemia secondary to gastrointestinal blood loss      in August 2008.  3. Hiatal hernia.  4. Gastroesophageal reflux disease.  5. Esophagitis.   IN-HOSPITAL OPERATIONS AND PROCEDURES:  1. Coronary artery bypass grafting x3, using a left internal mammary      artery to distal left anterior descending coronary artery,      saphenous vein graft to first diagonal branch, saphenous vein graft      to posterior descending coronary artery, endoscopic saphenous vein      harvest from right thigh.  2. Mitral valve repair using a 26 mm Edwards CT Logic ring      annuloplasty.   HISTORY AND PHYSICAL AND HOSPITAL COURSE:  The patient is a 65 year old  male who originally presented in August 2008 with profound generalized  weakness and exertional shortness of breath.  He was admitted through  the emergency department, where he was noted to have profound iron  deficiency anemia, with a hemoglobin of 4.1 and Hemoccult positive  stool.  There was no sign of active GI bleeding.  The patient was  evaluated by Dr. Matthias Hughs and underwent upper GI  endoscopy.  The patient  was found to have a large hiatal hernia with evidence of erosive  gastritis and esophagitis, but no sign of ongoing GI blood loss.  The  patient was transfused multiple units of blood products, with  appropriate increase in his hemoglobin and hematocrit.  He was found to  have no further signs of GI bleeding.  During his presentation, the  patient did develop EKG changes suggestive of acute myocardial  infarction.  He underwent cardiac catheterization by Dr. Sharyn Lull.  The  patient was found to have severe three-vessel coronary artery disease  with moderate left ventricular function.  The patient recovered from his  acute event and has done remarkably well from a clinical standpoint.  At  that time, the patient was seen and evaluated by Dr. Cornelius Moras.  Pending at  that time, if  the patient remained stable, will plan to pursue coronary  artery bypass grafting in the future.  The patient returned for followup  with Dr. Cornelius Moras in September 2008.  Dr. Cornelius Moras discussed with the patient  undergoing coronary artery bypass grafting as well as mitral  valve  repair.  He discussed risks and benefits with the patient.  The patient  acknowledged understanding and agreed to proceed.  Surgery was scheduled  for July 01, 2007.  For details of the patient's past medical  history and physical exam, please see dictated H&P.   The patient was taken to the operating room on July 01, 2007, where  he underwent coronary artery bypass grafting x3, using a left internal  mammary artery to the distal left anterior descending coronary artery,  saphenous vein graft to first diagonal branch, saphenous vein graft to  posterior descending coronary artery.  Endoscopic saphenous vein  harvesting from right side.  The patient also underwent mitral repair  using a 26 mm Edwards CT Logic ring annuloplasty.  The patient tolerated  this procedure well and was transferred to the intensive care unit  in  stable condition.  Postoperatively, the patient was noted to be  hemodynamically stable, with hematocrit 36%.  He was extubated the  evening of surgery.  Post extubation, the patient was noted to be alert  and oriented x4.  Neuro intact.  Postop day #1, the patient's vital  signs were noted be stable, and he was able to be weaned off Neo-  Synephrine and dopamine.  The patient's Swan-Ganz catheter was  discontinued in the normal fashion.  Postoperative chest x-ray noted to  be clear.  Minimal drainage from chest tubes, and chest tubes  discontinued in the normal fashion.  The patient remained in the  intensive care unit on postop day #1.  By postop day #2, patient's  hemoglobin and hematocrit were decreased to 8.2 and 25%.  Plan was to  follow his acute blood loss anemia.  The patient was asymptomatic.  He  did develop atrial fibrillation on postop day #2.  The patient has a  history of paroxysmal atrial fibrillation and admission in August 2008  after acute myocardial infarction.  Now postop day #2, the patient was  initiated on IV amiodarone.  This was continued for a total of 24 hours.  The patient's heart rate did drop to about 40s-50s.  He was placed on  VVI backup.  IV amiodarone was discontinued the following day.  P.o.  amiodarone was held on postop day #3.  Heart rate did improve.  Patient  still noted to be in atrial fibrillation with VVI backup at 50.  By  postop day #4, the patient's heart rate improved, and he was back in  normal sinus rhythm.  He was started on p.o. amiodarone.  The patient  tolerated this well, and he did remain in normal sinus rhythm over this  time.Marland Kitchen  He was continued on amiodarone 200 mg b.i.d.  The patient was  able to be transferred out of the intensive care unit to the PCTU postop  day #2.  He was in stable condition at that time.  The patient's acute  blood loss was monitored, and a hemoglobin and hematocrit obtained  postop day #3.  This  improved slightly to 8.8 and 27.5.  It was again  checked postop day #4 and remained stable at 8.8 and 27.5.  The patient  was asymptomatic.  He was continued on p.o. iron.  On the telemetry  floor, the patient's vital signs were monitored closely.  He remained  afebrile.  Vital signs remained stable, and he was able to be weaned off  oxygen, saturating greater than 90% on room air.  Currently, patient in  normal sinus rhythm.  His pulmonary status is stable.  Incisions clean,  dry, and intact, and healing well.  The patient did develop left forearm  phlebitis secondary to amiodarone infusion.  He was started on IV Ancef,  and warm compress was placed.  The forearm phlebitis did start to  improve.  Plan will be to continue the patient on home antibiotics for  several days postdischarge.  Postoperatively, the patient was initiated  on Coumadin secondary to the mitral valve repair to the annuloplasty  ring.  This was initiated postop day #2.  Daily PT/INR blood work was  obtained daily.  The patient's INR was increasing near therapeutic  daily.  Coumadin dosage adjusted appropriately.  Planned therapeutic to  be 2-3.  The patient did have slight volume overload postoperatively.  He was originally initiated on diuretics.  Unfortunately, the patient's  creatinine jumped up to 1.59 postop day #3.  Lasix was held.  Following  his Lasix, the patient's creatinine stabilized and was decreasing to  1.24 the following day.  The patient's weight was checked daily, and he  was slightly edematous, but continued to hold Lasix at that time.   Labs on postop day #4 showed a white count of 15.2, hemoglobin of 8.8,  hematocrit 27.5.  Sodium was 136, potassium 3.8, chloride of 165,  bicarbonate of 25, BUN of 25, creatinine of 1.24.  INR was 1.7, with PT  20.5.   The patient is tentatively ready for discharge home in the a.m. postop  day #6, July 07, 2007.  A follow-up appointment has been arranged   with Dr. Dorris Fetch for July 27, 2007 at 12:15 p.m..  The patient  will need to obtain a PA and lateral chest x-ray 30 minutes prior to  this appointment.  The patient will need to follow up with Dr. Sharyn Lull  in 2 weeks.  He will need to contact Dr. Annitta Jersey office to make these  arrangements.  The patient will also need to obtain PT/INR blood work on  July 09, 2007 at Dr. Annitta Jersey office.  The patient will need to  contact Dr. Annitta Jersey office to make these appointments.   ACTIVITY:  Patient instructed in no driving until released to do, no  lifting over 10 pounds.  He is told to ambulate 3-4 times per day,  progress as tolerated, and to continue his breathing exercises.   INCISIONAL CARE:  The patient is told to shower, washing his incisions  using soap and water.  He is to contact the office if he develops any  drainage or opening from any of his incision sites.   DIET:  The patient is continued on a diet to be lowfat, low salt.   DISCHARGE MEDICATIONS:  1. Ambien 5 mg at night p.r.n..  2. Aspirin 81 mg daily.  3. Altace 5 mg daily.  4. Lipitor 10 mg at night.  5. Niacin 500 mg b.i.d.  6. Coumadin 2.5 mg at night.  7. Ferrous sulfate 325 mg t.i.d.  8. Synthroid 50 mcg daily.  9. Amiodarone 200 mg b.i.d.  10.Keflex 500 mg t.i.d. x7 days.  11.Protonix 40 mg b.i.d.  12.Oxycodone 5 mg 1-2 tabs q.4-6 h. p.r.n. pain.      Theda Belfast, PA      [name]. Cornelius Moras, M.D.  Electronically Signed    KMD/MEDQ  D:  07/06/2007  T:  07/07/2007  Job:  [id number]   cc:   Eduardo Osier. Sharyn Lull, M.D.

## 2011-02-26 NOTE — Assessment & Plan Note (Signed)
OFFICE VISIT   [name], [name]  DOB:  06/21/1946                                        July 27, 2007  CHART #:  [id number]   The patient is a 65 year old gentleman who underwent coronary bypass  grafting x3 and a mitral annuloplasty by Dr. Cornelius Moras on July 01, 2007.  Postoperatively he had some atrial fibrillation and also had phlebitis  in his left arm but was discharged home on postoperative day #6 without  any significant complications.  Since that time, he has been doing well.  He remains on 5 mg of Coumadin daily.  He does still have some soreness.  He takes about 1/2 of an oxycodone 3 times a day for pain.  He has not  had any problems with anginal type chest pain or shortness of breath.  He does have very mild orthopnea when he tries to lay flat and still has  his head had slightly elevated.   PHYSICAL EXAMINATION:  General:  The patient is a 65 year old white male  in no acute distress.  Vital signs:  His blood pressure is 106/67, pulse  is 68 and regular, respirations are 18.  His oxygen saturation is 98% on  room air.  Lungs:  Clear with equal breath sounds.  Cardiac:  Regular  rate and rhythm.  Normal S1 and S2.  There is no murmur.  His sternum is  stable.  Sternal incision is clean, dry, and intact.  His leg incision  is healing well.  There is no peripheral edema.   Chest x-ray shows tiny bilateral effusions and good aeration of the  lungs.   IMPRESSION:  The patient is a 65 year old gentleman.  He is status post  coronary bypass grafting and mitral annuloplasty about 3.5 weeks ago at  this point.  He is doing quite well.   I did tell him that he could begin driving with appropriate precautions.  He currently does not have a car to utilize but appropriate precautions  were discussed.  He is not to lift any objects that weight greater than  10 pounds or do any heavy work with his arms for at least another 3  weeks.   We will set him  up with an appointment to see Dr. Cornelius Moras in one month. We  will leave the Coumadin on until that time, that is being managed  through Dr. Annitta Jersey office.  Overall, the patient is making good  progress.   I did give him a prescription for Altace 5 mg daily as he was running  low on that.  I gave him an additional prescription for oxycodone 5 mg  one half to one tablet 3 times daily as needed.   [name] Dorris Fetch, M.D.  Electronically Signed   SCH/MEDQ  D:  07/27/2007  T:  07/27/2007  Job:  [id number]   cc:   Eduardo Osier. Sharyn Lull, M.D.  [name]. Cornelius Moras, M.D.  Bernette Redbird, M.D.

## 2011-02-26 NOTE — Cardiovascular Report (Signed)
NAMETYREIK, DELAHOUSSAYE NO.:  [id number]   MEDICAL RECORD NO.:  [id number]          PATIENT TYPE:  INP   LOCATION:  2903                         FACILITY:  [facility]   PHYSICIAN:  Eduardo Osier. Sharyn Lull, M.D. DATE OF BIRTH:  October 29, 1945   DATE OF PROCEDURE:  06/06/2007  DATE OF DISCHARGE:                            CARDIAC CATHETERIZATION   PROCEDURES:  1. Left cardiac catheterization.  2. Selective left and right coronary angiography.  3. Left ventriculography via right groin using Judkins technique.   INDICATION FOR THE PROCEDURE:  Mr. Domagala is a 65 year old white male with  past medical history significant for hypertension, hypercholesterolemia,  degenerative joint disease, GERD, chronic back pain on chronic NSAID.  He came to the ER complaining of progressive worsening of shortness of  breath associated with feeling weak, tired and fatigued and pale, and  was noted to have hemoglobin of 4.1, and was noted to have elevated CPK  MB of 60.1 and troponin I 4.49.  initial EKG done in the ER showed  normal sinus rhythm, left atrial enlargement, LVH with strain pattern  and S-T depression in inferolateral leads and S-T depression in anterior  leads suggestive of possible posterior injury pattern.  Repeat EKG  showed normal sinus rhythm, LVH with S-T elevation and Q waves in lead  III and AVF and S-T depression in anterior leads suggestive of inferior  and posterior  wall acute injury pattern.  During the episode of  hypertension, the patient received 2 units of packed RBCs and IV fluids  with improvement in his blood pressure.  Patient denies any chest pain,  nausea, vomiting or diaphoresis.  Denies abdominal pain.  Denies  palpitation, lightheadedness or syncope.  He states he has noticed black  stools for more than 1 week.  Patient was admitted to CCU, was  transfused 4 units of packed RBCs with improvement of hemoglobin from  4.1 to 8.2 this morning and subsequently underwent  upper endoscopy today  which showed no active bleeding or blood in the stomach.  There was a  very large hiatal hernia.  There was evidence of erosive reflux  esophagitis without any evidence of active hemorrhage.  This morning,  his CPKs have progressively gone up to 1,845, MB of 221, delayed index  12.  Troponin I also have gone above 100.  I discussed with patient and  his wife regarding left cardiac cath with selective left and right  coronary angiography left ventriculography, PTC stenting, his risks and  benefits, i.e. at that a mild stroke, need for emergency CABG, risks of  restenosis localized further complications, risks of bleeding.  Bare-  metal stenting versus drug-eluting stenting and need for dual-  antiplatelet medications and consented for PCI.  Also discussed with Dr.  Matthias Hughs and is cleared from GI point to be started on any anticoagulants  or antiplatelet medications.   PROCEDURE:  After obtaining the informed consent, patient was brought to  the cath lab and was placed on the fluoroscopy table.  Right groin was  prepped and draped in the usual fashion.  Xylocaine 2% was  used for  local anesthesia in the right groin.  With the help of thin walled  needle, 6-French arterial and venous sheaths were placed.  Both of these  sheaths were aspirated and flushed.  Next, 6-French left Judkins  catheter was advanced over the wire under fluoroscopic guidance up to  the ascending aorta.  The wire was pulled down.  The catheter was  aspirated and connected to the manifold.  The catheter was further  advanced and engaged into left coronary ostium.  Multiple views of the  left system were taken.  Next, the catheter was disengaged and was  pulled out over the wire and was replaced with a 6-French right Judkins  catheter, which was advanced over the wire under fluoroscopic guidance  up to the ascending aorta.  The wire was pulled out.  The catheter was  aspirated and connected to the  manifold.  The catheter was further  advanced and engaged into the left coronary ostium.  Multiple views of  the right system were taken.  Next, the catheter was disengaged and was  pulled out over the wire and was replaced with a 6-French pigtail  catheter which was advanced over the wire under fluoroscopic guidance up  to the ascending aorta.  The wire was pulled out.  The catheter was  aspirated and connected to the manifold.  Catheter was further advanced  across the aortic valve into the LV.  LV pressures were recorded.  Next,  left ventriculograph was done in 30 degree RAO position.  Post  angiographic pressures were recorded from LV and then pullback pressures  were recorded from the aorta.  There was no gradient across the aortic  valve.  Next, the pigtail catheter was pulled out over the wire, sheaths  were aspirated and flushed.   FINDINGS:  LV showed severe global hypokinesia.  There was 2+ MR, EF of  20-25%.  Left main has 20-30% diffuse stenosis.  LAD has proximal and  mid long sequential 70-80% stenosis.  Diagonal 1 has ostial 85%  stenosis, which is large vessel.  Ramus is very small.  Left circumflex  is small, which is diffusely diseased distally in AV groove.  OM1 is  very small, which is patent.  RCA has 30-40% proximal stenosis with  filling defect and then 100% occluded beyond mid portion with filling  defects suggestive of thrombus with TIMI 0 flow.  Anterior descending is  filling by collaterals from the left system.  Patient tolerated the  procedure well.  Patient did not have any chest pain during the  procedure or any new EKG changes.  Patient was transferred to CCU in  stable condition.   Plan is to recommend CABG as soon as possible.  I discussed with Dr.  Cornelius Moras and he is going to see the patient later today.      Eduardo Osier. Sharyn Lull, M.D.  Electronically Signed     MNH/MEDQ  D:  06/06/2007  T:  06/07/2007  Job:  [id number]   cc:   [name]. Cornelius Moras, M.D.   [facility]  Cath Lab

## 2011-03-01 NOTE — Discharge Summary (Signed)
NAMENAREK, KNISS NO.:  [id number]   MEDICAL RECORD NO.:  [id number]          PATIENT TYPE:  INP   LOCATION:  2018                         FACILITY:  [facility]   PHYSICIAN:  [name]. Cornelius Moras, M.D. DATE OF BIRTH:  [birth year]   DATE OF ADMISSION:  07/01/2007  DATE OF DISCHARGE:  07/08/2007                               DISCHARGE SUMMARY   ADDENDUM   Mr. Tsuchiya was tentatively scheduled for discharge on approximately  July 06, 2007.  He did require 2additional days of hospitalization  to closely monitor his heart rhythm and adjust his medications further.  He had intermittent atrial fibrillation, but did subsequently convert to  a normal sinus rhythm with adjustments his medication including  restarting Toprol.  His laboratory values were also checked.  His Ancef  was discontinued and changed to oral Keflex.  He maintained normal sinus  rhythm overnight and on the morning of July 08, 2007, he was deemed  to be acceptable for discharge at that time.   DISCHARGE MEDICATIONS:  The same as previously dictated with the  exception of the additional medication Toprol ER 25 mg daily.   For further details of this hospitalization, please see the previously  dictated summary.      Rowe Clack, P.A.-C.      [name]. Cornelius Moras, M.D.  Electronically Signed    WEG/MEDQ  D:  10/09/2007  T:  10/09/2007  Job:  [id number]   cc:   [name]. Cornelius Moras, M.D.  Eduardo Osier. Sharyn Lull, M.D.

## 2011-07-25 LAB — CBC
HCT: 27.5 — ABNORMAL LOW
HCT: 29.2 — ABNORMAL LOW
HCT: 39.8
Hemoglobin: 12.7 — ABNORMAL LOW
Hemoglobin: 9.4 — ABNORMAL LOW
Hemoglobin: 9.4 — ABNORMAL LOW
MCHC: 31.8
MCHC: 32
MCHC: 32.1
MCHC: 32.2
MCV: 76.5 — ABNORMAL LOW
MCV: 76.7 — ABNORMAL LOW
MCV: 77.4 — ABNORMAL LOW
MCV: 77.4 — ABNORMAL LOW
MCV: 77.5 — ABNORMAL LOW
MCV: 77.9 — ABNORMAL LOW
MCV: 79
Platelets: 120 — ABNORMAL LOW
Platelets: 140 — ABNORMAL LOW
Platelets: 150
Platelets: 168
Platelets: 245
Platelets: ADEQUATE
RBC: 3.24 — ABNORMAL LOW
RBC: 3.4 — ABNORMAL LOW
RBC: 3.49 — ABNORMAL LOW
RBC: 3.79 — ABNORMAL LOW
RBC: 4.36
RBC: 5.19
RDW: 32.6 — ABNORMAL HIGH
WBC: 11.3 — ABNORMAL HIGH
WBC: 11.6 — ABNORMAL HIGH
WBC: 13.4 — ABNORMAL HIGH
WBC: 14.1 — ABNORMAL HIGH
WBC: 15.2 — ABNORMAL HIGH
WBC: 16.3 — ABNORMAL HIGH
WBC: 7.5

## 2011-07-25 LAB — BLOOD GAS, ARTERIAL
Acid-base deficit: 1.6
Bicarbonate: 22
TCO2: 23
pCO2 arterial: 33.3 — ABNORMAL LOW
pH, Arterial: 7.435

## 2011-07-25 LAB — POCT I-STAT 3, ART BLOOD GAS (G3+)
Acid-base deficit: 2
Acid-base deficit: 5 — ABNORMAL HIGH
Acid-base deficit: 7 — ABNORMAL HIGH
Bicarbonate: 23.4
O2 Saturation: 100
O2 Saturation: 94
O2 Saturation: 99
Patient temperature: 37.1
TCO2: 22
TCO2: 25
pCO2 arterial: 34.7 — ABNORMAL LOW
pCO2 arterial: 39.5
pH, Arterial: 7.341 — ABNORMAL LOW
pO2, Arterial: 290 — ABNORMAL HIGH

## 2011-07-25 LAB — TYPE AND SCREEN
ABO/RH(D): O POS
Antibody Screen: NEGATIVE

## 2011-07-25 LAB — BASIC METABOLIC PANEL
BUN: 11
BUN: 13
BUN: 25 — ABNORMAL HIGH
BUN: 25 — ABNORMAL HIGH
CO2: 24
Calcium: 7.5 — ABNORMAL LOW
Calcium: 8 — ABNORMAL LOW
Calcium: 8.1 — ABNORMAL LOW
Chloride: 104
Chloride: 113 — ABNORMAL HIGH
Creatinine, Ser: 0.83
Creatinine, Ser: 0.93
Creatinine, Ser: 1.24
Creatinine, Ser: 1.59 — ABNORMAL HIGH
GFR calc Af Amer: >60
GFR calc Af Amer: >60
GFR calc Af Amer: >60
GFR calc Af Amer: >60
GFR calc non Af Amer: 45 — ABNORMAL LOW
GFR calc non Af Amer: 59 — ABNORMAL LOW
GFR calc non Af Amer: >60
GFR calc non Af Amer: >60
Glucose, Bld: 117 — ABNORMAL HIGH
Potassium: 4.8
Sodium: 143

## 2011-07-25 LAB — I-STAT 8, (EC8 V) (CONVERTED LAB)
Acid-base deficit: 3 — ABNORMAL HIGH
Chloride: 104
HCT: 31 — ABNORMAL LOW
Hemoglobin: 10.5 — ABNORMAL LOW
Operator id: 297351
Potassium: 4.8
Sodium: 138
TCO2: 23
pH, Ven: 7.342 — ABNORMAL HIGH

## 2011-07-25 LAB — POCT I-STAT 4, (NA,K, GLUC, HGB,HCT)
Glucose, Bld: 102 — ABNORMAL HIGH
Glucose, Bld: 111 — ABNORMAL HIGH
Glucose, Bld: 130 — ABNORMAL HIGH
Glucose, Bld: 90
HCT: 22 — ABNORMAL LOW
HCT: 35 — ABNORMAL LOW
HCT: 36 — ABNORMAL LOW
Hemoglobin: 12.2 — ABNORMAL LOW
Hemoglobin: 7.5 — CL
Operator id: 3406
Potassium: 3.7
Potassium: 4
Potassium: 4.7
Sodium: 133 — ABNORMAL LOW
Sodium: 137
Sodium: 139
Sodium: 140

## 2011-07-25 LAB — COMPREHENSIVE METABOLIC PANEL
AST: 31
BUN: 13
CO2: 22
Calcium: 9.7
Chloride: 110
Creatinine, Ser: 0.99
GFR calc Af Amer: >60
GFR calc non Af Amer: >60
Glucose, Bld: 82
Total Bilirubin: 0.5

## 2011-07-25 LAB — PROTIME-INR
INR: 1
INR: 1.3
INR: 1.5
INR: 1.6 — ABNORMAL HIGH
INR: 1.7 — ABNORMAL HIGH
INR: 1.7 — ABNORMAL HIGH
Prothrombin Time: 13.4
Prothrombin Time: 16.1 — ABNORMAL HIGH
Prothrombin Time: 16.2 — ABNORMAL HIGH
Prothrombin Time: 18.2 — ABNORMAL HIGH
Prothrombin Time: 18.3 — ABNORMAL HIGH
Prothrombin Time: 19.6 — ABNORMAL HIGH
Prothrombin Time: 20.4 — ABNORMAL HIGH
Prothrombin Time: 20.5 — ABNORMAL HIGH

## 2011-07-25 LAB — I-STAT EC8
BUN: 11
Chloride: 108
HCT: 35 — ABNORMAL LOW
pCO2 arterial: 39.3
pH, Arterial: 7.339 — ABNORMAL LOW

## 2011-07-25 LAB — URINALYSIS, ROUTINE W REFLEX MICROSCOPIC
Bilirubin Urine: NEGATIVE
Glucose, UA: NEGATIVE
Hgb urine dipstick: NEGATIVE
Protein, ur: NEGATIVE
Urobilinogen, UA: 0.2

## 2011-07-25 LAB — MAGNESIUM
Magnesium: 2.3
Magnesium: 2.3

## 2011-07-25 LAB — HEMOGLOBIN A1C
Hgb A1c MFr Bld: 5.1
Mean Plasma Glucose: 104

## 2011-07-25 LAB — CREATININE, SERUM
GFR calc Af Amer: >60
GFR calc non Af Amer: >60

## 2011-07-26 LAB — DIFFERENTIAL
Basophils Absolute: 0
Basophils Relative: 0
Basophils Relative: 0
Eosinophils Relative: 1
Lymphocytes Relative: 4 — ABNORMAL LOW
Monocytes Absolute: 1.2 — ABNORMAL HIGH
Monocytes Relative: 1 — ABNORMAL LOW
Neutro Abs: 9.9 — ABNORMAL HIGH

## 2011-07-26 LAB — IRON AND TIBC
Iron: 10 — ABNORMAL LOW
Iron: 12 — ABNORMAL LOW
Iron: 13 — ABNORMAL LOW
Iron: 14 — ABNORMAL LOW
Iron: 15 — ABNORMAL LOW
Iron: 20 — ABNORMAL LOW
Iron: 21 — ABNORMAL LOW
Iron: 27 — ABNORMAL LOW
Saturation Ratios: 3 — ABNORMAL LOW
Saturation Ratios: 3 — ABNORMAL LOW
Saturation Ratios: 4 — ABNORMAL LOW
Saturation Ratios: 4 — ABNORMAL LOW
Saturation Ratios: 5 — ABNORMAL LOW
Saturation Ratios: 6 — ABNORMAL LOW
Saturation Ratios: 6 — ABNORMAL LOW
Saturation Ratios: 7 — ABNORMAL LOW
TIBC: 290
TIBC: 303
TIBC: 312
TIBC: 334
TIBC: 338
TIBC: 345
TIBC: 353
TIBC: 396
UIBC: 269
UIBC: 288
UIBC: 298
UIBC: 314
UIBC: 319
UIBC: 335
UIBC: 383
UIBC: 404

## 2011-07-26 LAB — CBC
HCT: 14.9 — ABNORMAL LOW
HCT: 29.1 — ABNORMAL LOW
HCT: 31.5 — ABNORMAL LOW
HCT: 31.7 — ABNORMAL LOW
HCT: 33.9 — ABNORMAL LOW
HCT: 34.2 — ABNORMAL LOW
HCT: 35.5 — ABNORMAL LOW
Hemoglobin: 10.1 — ABNORMAL LOW
Hemoglobin: 10.2 — ABNORMAL LOW
Hemoglobin: 10.4 — ABNORMAL LOW
Hemoglobin: 10.6 — ABNORMAL LOW
Hemoglobin: 10.9 — ABNORMAL LOW
Hemoglobin: 11.2 — ABNORMAL LOW
Hemoglobin: 11.4 — ABNORMAL LOW
Hemoglobin: 8.2 — ABNORMAL LOW
Hemoglobin: 9.4 — ABNORMAL LOW
Hemoglobin: 9.6 — ABNORMAL LOW
MCHC: 31.2
MCHC: 31.3
MCHC: 31.5
MCHC: 31.8
MCHC: 32
MCHC: 32
MCHC: 32.1
MCHC: 32.3
MCV: 66.4 — ABNORMAL LOW
MCV: 69.7 — ABNORMAL LOW
MCV: 71.2 — ABNORMAL LOW
MCV: 72.8 — ABNORMAL LOW
MCV: 72.8 — ABNORMAL LOW
MCV: 73 — ABNORMAL LOW
MCV: 73.1 — ABNORMAL LOW
MCV: 73.5 — ABNORMAL LOW
Platelets: 206
Platelets: 216
Platelets: 229
Platelets: 274
Platelets: 276
Platelets: 276
Platelets: 326
RBC: 3.95 — ABNORMAL LOW
RBC: 4.09 — ABNORMAL LOW
RBC: 4.31
RBC: 4.35
RBC: 4.62
RBC: 4.68
RBC: 4.88
RDW: 23.5 — ABNORMAL HIGH
RDW: 37.4 — ABNORMAL HIGH
RDW: 37.9 — ABNORMAL HIGH
RDW: 38 — ABNORMAL HIGH
RDW: 38.2 — ABNORMAL HIGH
RDW: 38.2 — ABNORMAL HIGH
RDW: 39 — ABNORMAL HIGH
RDW: 39.1 — ABNORMAL HIGH
WBC: 12.2 — ABNORMAL HIGH
WBC: 13.2 — ABNORMAL HIGH
WBC: 14.5 — ABNORMAL HIGH
WBC: 14.5 — ABNORMAL HIGH
WBC: 15.2 — ABNORMAL HIGH
WBC: 16.6 — ABNORMAL HIGH
WBC: 9.7

## 2011-07-26 LAB — BASIC METABOLIC PANEL
BUN: 16
BUN: 18
CO2: 19
CO2: 19
CO2: 20
Calcium: 7.8 — ABNORMAL LOW
Calcium: 8.1 — ABNORMAL LOW
Chloride: 109
Chloride: 109
Chloride: 110
Chloride: 111
Creatinine, Ser: 1.01
GFR calc Af Amer: >60
GFR calc Af Amer: >60
GFR calc Af Amer: >60
GFR calc non Af Amer: >60
Glucose, Bld: 83
Potassium: 4
Potassium: 4.1
Potassium: 4.1
Sodium: 137
Sodium: 137
Sodium: 139

## 2011-07-26 LAB — FOLATE
Folate: 10.1
Folate: 10.4
Folate: 10.5
Folate: 6.4
Folate: 7.2
Folate: 7.6
Folate: 8.2

## 2011-07-26 LAB — HEPARIN LEVEL (UNFRACTIONATED)
Heparin Unfractionated: 0.15 — ABNORMAL LOW
Heparin Unfractionated: 0.38
Heparin Unfractionated: <0.1 — ABNORMAL LOW

## 2011-07-26 LAB — HEMOGLOBIN AND HEMATOCRIT, BLOOD
HCT: 31.6 — ABNORMAL LOW
HCT: 32.8 — ABNORMAL LOW
Hemoglobin: 10.8 — ABNORMAL LOW
Hemoglobin: 9.9 — ABNORMAL LOW

## 2011-07-26 LAB — TYPE AND SCREEN

## 2011-07-26 LAB — RETICULOCYTES
RBC.: 3.5 — ABNORMAL LOW
RBC.: 4.21 — ABNORMAL LOW
RBC.: 4.31
RBC.: 4.61
RBC.: 4.79
RBC.: 4.89
RBC.: 5.01
RBC.: 5.18
Retic Count, Absolute: 10.5 — ABNORMAL LOW
Retic Count, Absolute: 115.2
Retic Count, Absolute: 12.6 — ABNORMAL LOW
Retic Count, Absolute: 141.8
Retic Count, Absolute: 150.2
Retic Count, Absolute: 155.6
Retic Count, Absolute: 167.7
Retic Count, Absolute: 64.7
Retic Count, Absolute: 87.6
Retic Ct Pct: 0.3 — ABNORMAL LOW
Retic Ct Pct: 1.5
Retic Ct Pct: 1.9
Retic Ct Pct: 2.3
Retic Ct Pct: 2.9
Retic Ct Pct: 3.5 — ABNORMAL HIGH

## 2011-07-26 LAB — URINALYSIS, MICROSCOPIC ONLY
Bilirubin Urine: NEGATIVE
Glucose, UA: NEGATIVE
Hgb urine dipstick: NEGATIVE
Ketones, ur: NEGATIVE
Leukocytes, UA: NEGATIVE
Nitrite: NEGATIVE
Protein, ur: NEGATIVE
Specific Gravity, Urine: 1.015
Urobilinogen, UA: 0.2
pH: 5.5

## 2011-07-26 LAB — COMPREHENSIVE METABOLIC PANEL
ALT: 39
AST: 82 — ABNORMAL HIGH
Albumin: 2.4 — ABNORMAL LOW
Albumin: 3.6
Alkaline Phosphatase: 59
Alkaline Phosphatase: 64
Alkaline Phosphatase: 69
BUN: 16
BUN: 17
BUN: 26 — ABNORMAL HIGH
CO2: 20
CO2: 20
Chloride: 108
Chloride: 109
Creatinine, Ser: 0.96
Creatinine, Ser: 1.38
GFR calc Af Amer: >60
GFR calc non Af Amer: >60
GFR calc non Af Amer: >60
Glucose, Bld: 103 — ABNORMAL HIGH
Potassium: 3.3 — ABNORMAL LOW
Potassium: 4.2
Potassium: 4.2
Sodium: 136
Total Bilirubin: 0.8
Total Bilirubin: 0.8
Total Protein: 6.6

## 2011-07-26 LAB — CK TOTAL AND CKMB (NOT AT ARMC)
CK, MB: 60.1 — ABNORMAL HIGH
Relative Index: 11.5 — ABNORMAL HIGH
Total CK: 523 — ABNORMAL HIGH
Total CK: 57
Total CK: 80

## 2011-07-26 LAB — BASIC METABOLIC PANEL WITH GFR
BUN: 16
CO2: 24
Calcium: 8.2 — ABNORMAL LOW
Creatinine, Ser: 0.95
GFR calc Af Amer: >60
GFR calc non Af Amer: >60
Glucose, Bld: 88
Glucose, Bld: 96
Potassium: 3.5
Sodium: 135

## 2011-07-26 LAB — URINE CULTURE
Colony Count: NO GROWTH
Culture: NO GROWTH

## 2011-07-26 LAB — PROTIME-INR
INR: 1.2
Prothrombin Time: 15

## 2011-07-26 LAB — CARDIAC PANEL(CRET KIN+CKTOT+MB+TROPI)
Relative Index: 12 — ABNORMAL HIGH
Relative Index: 13 — ABNORMAL HIGH
Relative Index: 5.8 — ABNORMAL HIGH
Troponin I: 21.29

## 2011-07-26 LAB — TROPONIN I
Troponin I: 4.49
Troponin I: 5.6

## 2011-07-26 LAB — HEPATIC FUNCTION PANEL
AST: 30
Bilirubin, Direct: 0.1
Indirect Bilirubin: 0.7
Total Protein: 5.6 — ABNORMAL LOW

## 2011-07-26 LAB — PREPARE RBC (CROSSMATCH)

## 2011-07-26 LAB — FERRITIN
Ferritin: 15 — ABNORMAL LOW (ref 22–322)
Ferritin: 15 — ABNORMAL LOW (ref 22–322)
Ferritin: 22 (ref 22–322)
Ferritin: 3 — ABNORMAL LOW (ref 22–322)
Ferritin: 4 — ABNORMAL LOW (ref 22–322)
Ferritin: 41 (ref 22–322)
Ferritin: 42 (ref 22–322)
Ferritin: 7 — ABNORMAL LOW (ref 22–322)

## 2011-07-26 LAB — TSH
TSH: 2.711
TSH: 5.588 — ABNORMAL HIGH

## 2011-07-26 LAB — LIPID PANEL
Cholesterol: 98
HDL: 27 — ABNORMAL LOW
LDL Cholesterol: 54
Triglycerides: 83

## 2011-07-26 LAB — VITAMIN B12
Vitamin B-12: 1093 — ABNORMAL HIGH (ref 211–911)
Vitamin B-12: 1142 — ABNORMAL HIGH (ref 211–911)
Vitamin B-12: 417 (ref 211–911)
Vitamin B-12: 542 (ref 211–911)
Vitamin B-12: 743 (ref 211–911)
Vitamin B-12: 765 (ref 211–911)
Vitamin B-12: 778 (ref 211–911)

## 2011-07-26 LAB — PSA: PSA: 0.8

## 2011-07-26 LAB — OCCULT BLOOD X 1 CARD TO LAB, STOOL: Fecal Occult Bld: POSITIVE

## 2011-07-26 LAB — ABO/RH: ABO/RH(D): O POS

## 2011-07-26 LAB — HEPATITIS PANEL, ACUTE
Hep A IgM: NEGATIVE
Hep B C IgM: NEGATIVE
Hepatitis B Surface Ag: NEGATIVE

## 2012-01-04 ENCOUNTER — Encounter (HOSPITAL_COMMUNITY): Payer: Self-pay | Admitting: *Deleted

## 2012-01-04 ENCOUNTER — Emergency Department (HOSPITAL_COMMUNITY): Payer: BC Managed Care – PPO

## 2012-01-04 ENCOUNTER — Emergency Department (HOSPITAL_COMMUNITY)
Admission: EM | Admit: 2012-01-04 | Discharge: 2012-01-04 | Disposition: A | Discharge status: Home / Self Care | Payer: BC Managed Care – PPO | Attending: Emergency Medicine | Admitting: Emergency Medicine

## 2012-01-04 DIAGNOSIS — E785 Hyperlipidemia, unspecified: Secondary | ICD-10-CM | POA: Insufficient documentation

## 2012-01-04 DIAGNOSIS — R42 Dizziness and giddiness: Secondary | ICD-10-CM | POA: Insufficient documentation

## 2012-01-04 DIAGNOSIS — I252 Old myocardial infarction: Secondary | ICD-10-CM | POA: Insufficient documentation

## 2012-01-04 DIAGNOSIS — F29 Unspecified psychosis not due to a substance or known physiological condition: Secondary | ICD-10-CM | POA: Insufficient documentation

## 2012-01-04 DIAGNOSIS — S0990XA Unspecified injury of head, initial encounter: Secondary | ICD-10-CM

## 2012-01-04 DIAGNOSIS — W19XXXA Unspecified fall, initial encounter: Secondary | ICD-10-CM

## 2012-01-04 DIAGNOSIS — S0001XA Abrasion of scalp, initial encounter: Secondary | ICD-10-CM

## 2012-01-04 DIAGNOSIS — I10 Essential (primary) hypertension: Secondary | ICD-10-CM | POA: Insufficient documentation

## 2012-01-04 DIAGNOSIS — W108XXA Fall (on) (from) other stairs and steps, initial encounter: Secondary | ICD-10-CM | POA: Insufficient documentation

## 2012-01-04 DIAGNOSIS — Z954 Presence of other heart-valve replacement: Secondary | ICD-10-CM | POA: Insufficient documentation

## 2012-01-04 DIAGNOSIS — IMO0002 Reserved for concepts with insufficient information to code with codable children: Secondary | ICD-10-CM | POA: Insufficient documentation

## 2012-01-04 HISTORY — DX: Other specified postprocedural states: Z98.890

## 2012-01-04 HISTORY — DX: Acute myocardial infarction, unspecified: I21.9

## 2012-01-04 HISTORY — DX: Diaphragmatic hernia without obstruction or gangrene: K44.9

## 2012-01-04 HISTORY — DX: Pure hypercholesterolemia, unspecified: E78.00

## 2012-01-04 HISTORY — DX: Disorder of thyroid, unspecified: E07.9

## 2012-01-04 HISTORY — DX: Essential (primary) hypertension: I10

## 2012-01-04 MED ORDER — HYDROCODONE-ACETAMINOPHEN 5-325 MG PO TABS
1.0000 | ORAL_TABLET | Freq: Once | ORAL | Status: AC
  Administered 2012-01-04: 1 via ORAL
  Filled 2012-01-04: qty 1

## 2012-01-04 MED ORDER — HYDROCODONE-ACETAMINOPHEN 5-325 MG PO TABS: 1.0000 | ORAL_TABLET | Freq: Once | ORAL | Status: AC

## 2012-01-04 NOTE — ED Provider Notes (Signed)
Medical screening examination/treatment/procedure(s) were performed by non-physician practitioner and as supervising physician I was immediately available for consultation/collaboration.   Hanley Seamen, MD 01/04/12 [phone]

## 2012-01-04 NOTE — ED Provider Notes (Signed)
History     CSN: [id number]  Arrival date & time 01/04/12  0009   First MD Initiated Contact with Patient 01/04/12 [phone]      Chief Complaint  Patient presents with  . Fall  . Head Injury    (Consider location/radiation/quality/duration/timing/severity/associated sxs/prior treatment) HPI Comments: Patient was walking his dog, going up steps with the lesion.  One hand and a PAC is in the other when his leg got tangled in the legs and he fell forward landing on his knees and hitting the left occipital area of his head on a step.  He had momentary confusion and dizziness.  He has a small all superficial abrasion to his head/scalp  Patient is a 66 y.o. male presenting with fall and head injury. The history is provided by the patient.  Fall The accident occurred 1 to 2 hours ago. The fall occurred while walking. He fell from a height of 3 to 5 ft. He landed on a hard floor. The point of impact was the head, left knee and right knee. Pertinent negatives include no nausea.  Head Injury     Past Medical History  Diagnosis Date  . Hypertension   . Myocardial infarction   . Hypercholesteremia   . Hiatal hernia   . Thyroid disease   . History of mitral valve repair     Past Surgical History  Procedure Date  . Abdominal aortic aneurysm repair     History reviewed. No pertinent family history.  History  Substance Use Topics  . Smoking status: Former Smoker    Types: Cigarettes    Quit date: 01/03/2002  . Smokeless tobacco: Not on file  . Alcohol Use: Yes     rarely      Review of Systems  HENT: Negative for rhinorrhea, neck pain and neck stiffness.   Eyes: Negative for visual disturbance.  Gastrointestinal: Negative for nausea.  Skin: Positive for wound.  Neurological: Negative for dizziness.  Psychiatric/Behavioral: Positive for confusion.    Allergies  Review of patient's allergies indicates no known allergies.  Home Medications   Current Outpatient Rx  Name  Route Sig Dispense Refill  . AMLODIPINE BESYLATE 5 MG PO TABS Oral Take 5 mg by mouth daily.    . ASPIRIN 81 MG PO TABS Oral Take 81 mg by mouth daily.    . ATORVASTATIN CALCIUM 40 MG PO TABS Oral Take 40 mg by mouth every evening.    Marland Kitchen CARVEDILOL 25 MG PO TABS Oral Take 25 mg by mouth 2 (two) times daily with a meal.    . LEVOTHYROXINE SODIUM 50 MCG PO TABS Oral Take 50 mcg by mouth daily.    Marland Kitchen RAMIPRIL 10 MG PO TABS Oral Take 10 mg by mouth daily.      BP 131/91  Pulse 64  Temp(Src) 97.6 F (36.4 C) (Oral)  Resp 16  SpO2 99%  Physical Exam  Constitutional: He appears well-developed and well-nourished.  HENT:  Head: Normocephalic.  Eyes: Pupils are equal, round, and reactive to light.  Neck: Normal range of motion. Neck supple.  Cardiovascular: Normal rate.   Musculoskeletal: Normal range of motion.  Neurological: He is alert.  Skin: Abrasion noted.       ED Course  Procedures (including critical care time)  Labs Reviewed - No data to display No results found.   No diagnosis found.    MDM  CT head due to patient's confusion and use of aspirin on a regular basis.  He  does have a superficial abrasion to the left occipital area.  His tetanus status is up to date  Discussed results of head CT scan reviewed are rated signs and symptoms.  The patient should be concerned with and return for further evaluation      Arman Filter, NP 01/04/12 0457  Arman Filter, NP 01/04/12 [phone]

## 2012-01-04 NOTE — ED Notes (Signed)
Pt c/o fall while walking the dog. Pt fell forward then twisted side-ways. Pt struck L side of head w/ brief positive LoC. Pt takes ASA 81 mg. Pt denies n/v at this time. Pt denies confusion at this time, but stated he was slightly confused after striking head.

## 2012-01-04 NOTE — Discharge Instructions (Signed)
Abrasions Abrasions are skin scrapes. Their treatment depends on how large and deep the abrasion is. Abrasions do not extend through all layers of the skin. A cut or lesion through all skin layers is called a laceration. HOME CARE INSTRUCTIONS   If you were given a dressing, change it at least once a day or as instructed by your caregiver. If the bandage sticks, soak it off with a solution of water or hydrogen peroxide.   Twice a day, wash the area with soap and water to remove all the cream/ointment. You may do this in a sink, under a tub faucet, or in a shower. Rinse off the soap and pat dry with a clean towel. Look for signs of infection (see below).   Reapply cream/ointment according to your caregiver's instruction. This will help prevent infection and keep the bandage from sticking. Telfa or gauze over the wound and under the dressing or wrap will also help keep the bandage from sticking.   If the bandage becomes wet, dirty, or develops a foul smell, change it as soon as possible.   Only take over-the-counter or prescription medicines for pain, discomfort, or fever as directed by your caregiver.  SEEK IMMEDIATE MEDICAL CARE IF:   Increasing pain in the wound.   Signs of infection develop: redness, swelling, surrounding area is tender to touch, or pus coming from the wound.   You have a fever.   Any foul smell coming from the wound or dressing.  Most skin wounds heal within ten days. Facial wounds heal faster. However, an infection may occur despite proper treatment. You should have the wound checked for signs of infection within 24 to 48 hours or sooner if problems arise. If you were not given a wound-check appointment, look closely at the wound yourself on the second day for early signs of infection listed above. MAKE SURE YOU:   Understand these instructions.   Will watch your condition.   Will get help right away if you are not doing well or get worse.  Document Released:  07/10/2005 Document Revised: 09/19/2011 Document Reviewed: 09/03/2011 [facility] Patient Information 2012 [place], [place].Concussion and Brain Injury A blow to the head can stop the brain from working normally (concussion). It is usually not life-threatening. However, the results of the injury can be serious. Problems caused by the injury might show up right away or days or weeks later. Getting better might take some time. HOME CARE  Rest your body. Ways to rest your body include:   Getting plenty of sleep at night.   Going to sleep early.   Taking naps during the day when you feel tired.   Limit activities that require a lot of thought. This includes:   Time spent with homework.   Time spent with work related to a job.   TV watching.   Computer use.   Return to normal activities (driving, work, school) only when your doctor says it is okay.   Avoid high impact activity and sports until your doctor says it is okay.   Take medicines only as told by your doctor.   Do not drink alcohol until your doctor says it is okay.   Do not make important decisions without help until you feel better.   Follow up with your doctor as told.  GET HELP RIGHT AWAY IF:  You, your family, or your friends notice that:  You have bad headaches, or they get worse.   You have weakness, loss of feeling (numbness),  or you feel off balance.   You keep throwing up (vomiting).   You feel tired or pass out (faint).   One black center of your eye (pupil) is larger than the other.   You twitch or shake (seize).   Your speech is not clear (slurred).   You are confused, restless, easily angered (agitated), or annoyed (irritable).   You cannot recognize or respond to people or activities.   You have neck pain.   You have trouble being woken up.   Your behavior changes.  MAKE SURE YOU:   Understand these instructions.   Will watch your condition.   Will get help right away if you are not  doing well or get worse.  Document Released: 09/18/2009 Document Revised: 09/19/2011 Document Reviewed: 09/18/2009 [facility] Patient Information 2012 [place], [place]. Denies your head CT scan is normal.  You have been given instructions on concussion and brain injury with signs and symptoms to watch for if these occur or you become concerned in any way please return for further evaluation

## 2017-08-06 ENCOUNTER — Other Ambulatory Visit: Payer: Self-pay | Admitting: Cardiology

## 2017-08-06 DIAGNOSIS — R079 Chest pain, unspecified: Secondary | ICD-10-CM

## 2017-08-18 ENCOUNTER — Encounter (HOSPITAL_COMMUNITY)
Admission: RE | Admit: 2017-08-18 | Discharge: 2017-08-18 | Disposition: A | Discharge status: Home / Self Care | Payer: Medicare Other | Source: Ambulatory Visit | Attending: Cardiology | Admitting: Cardiology

## 2017-08-18 DIAGNOSIS — R079 Chest pain, unspecified: Secondary | ICD-10-CM | POA: Diagnosis present

## 2017-08-18 MED ORDER — TECHNETIUM TC 99M TETROFOSMIN IV KIT
10.0000 | PACK | Freq: Once | INTRAVENOUS | Status: AC | PRN
  Administered 2017-08-18: 10 via INTRAVENOUS

## 2017-08-18 MED ORDER — TECHNETIUM TC 99M TETROFOSMIN IV KIT
30.0000 | PACK | Freq: Once | INTRAVENOUS | Status: AC | PRN
  Administered 2017-08-18: 30 via INTRAVENOUS

## 2017-08-18 MED ORDER — REGADENOSON 0.4 MG/5ML IV SOLN
INTRAVENOUS | Status: AC
  Administered 2017-08-18: 0.4 mg via INTRAVENOUS
  Filled 2017-08-18: qty 5

## 2017-08-18 MED ORDER — REGADENOSON 0.4 MG/5ML IV SOLN
0.4000 mg | Freq: Once | INTRAVENOUS | Status: AC
  Administered 2017-08-18: 0.4 mg via INTRAVENOUS

## 2020-08-14 ENCOUNTER — Ambulatory Visit: Payer: Medicare Other | Admitting: *Deleted

## 2020-08-14 ENCOUNTER — Ambulatory Visit: Payer: Medicare Other | Attending: Critical Care Medicine

## 2020-08-14 DIAGNOSIS — Z23 Encounter for immunization: Secondary | ICD-10-CM

## 2020-08-14 NOTE — Progress Notes (Signed)
Patient reports no concerns with previous dosing. Patient received .24ml for not being immunocompromised. Patient observed with no concerns.

## 2020-08-14 NOTE — Progress Notes (Signed)
   Covid-19 Vaccination Clinic  Name:  [name]    MRN: [id number] DOB: 1946/01/07  08/14/2020  Mr. [name] was observed post Covid-19 immunization for 15 minutes without incident. He was provided with Vaccine Information Sheet and instruction to access the V-Safe system.   Mr. [name] was instructed to call 911 with any severe reactions post vaccine: Marland Kitchen Difficulty breathing  . Swelling of face and throat  . A fast heartbeat  . A bad rash all over body  . Dizziness and weakness

## 2021-12-26 DIAGNOSIS — E039 Hypothyroidism, unspecified: Secondary | ICD-10-CM | POA: Diagnosis not present

## 2021-12-26 DIAGNOSIS — I25118 Atherosclerotic heart disease of native coronary artery with other forms of angina pectoris: Secondary | ICD-10-CM | POA: Diagnosis not present

## 2021-12-26 DIAGNOSIS — I5020 Unspecified systolic (congestive) heart failure: Secondary | ICD-10-CM | POA: Diagnosis not present

## 2021-12-26 DIAGNOSIS — I480 Paroxysmal atrial fibrillation: Secondary | ICD-10-CM | POA: Diagnosis not present

## 2021-12-26 DIAGNOSIS — I10 Essential (primary) hypertension: Secondary | ICD-10-CM | POA: Diagnosis not present

## 2021-12-26 DIAGNOSIS — E785 Hyperlipidemia, unspecified: Secondary | ICD-10-CM | POA: Diagnosis not present

## 2022-04-10 DIAGNOSIS — I5020 Unspecified systolic (congestive) heart failure: Secondary | ICD-10-CM | POA: Diagnosis not present

## 2022-04-10 DIAGNOSIS — I25118 Atherosclerotic heart disease of native coronary artery with other forms of angina pectoris: Secondary | ICD-10-CM | POA: Diagnosis not present

## 2022-04-10 DIAGNOSIS — I10 Essential (primary) hypertension: Secondary | ICD-10-CM | POA: Diagnosis not present

## 2022-04-10 DIAGNOSIS — I480 Paroxysmal atrial fibrillation: Secondary | ICD-10-CM | POA: Diagnosis not present

## 2022-07-09 DIAGNOSIS — I2510 Atherosclerotic heart disease of native coronary artery without angina pectoris: Secondary | ICD-10-CM | POA: Diagnosis not present

## 2022-07-09 DIAGNOSIS — E785 Hyperlipidemia, unspecified: Secondary | ICD-10-CM | POA: Diagnosis not present

## 2022-07-09 DIAGNOSIS — E039 Hypothyroidism, unspecified: Secondary | ICD-10-CM | POA: Diagnosis not present

## 2022-07-09 DIAGNOSIS — I10 Essential (primary) hypertension: Secondary | ICD-10-CM | POA: Diagnosis not present

## 2022-07-10 DIAGNOSIS — I480 Paroxysmal atrial fibrillation: Secondary | ICD-10-CM | POA: Diagnosis not present

## 2022-07-10 DIAGNOSIS — I519 Heart disease, unspecified: Secondary | ICD-10-CM | POA: Diagnosis not present

## 2022-07-10 DIAGNOSIS — I25118 Atherosclerotic heart disease of native coronary artery with other forms of angina pectoris: Secondary | ICD-10-CM | POA: Diagnosis not present

## 2022-07-10 DIAGNOSIS — I10 Essential (primary) hypertension: Secondary | ICD-10-CM | POA: Diagnosis not present

## 2022-10-09 DIAGNOSIS — I420 Dilated cardiomyopathy: Secondary | ICD-10-CM | POA: Diagnosis not present

## 2022-10-09 DIAGNOSIS — I480 Paroxysmal atrial fibrillation: Secondary | ICD-10-CM | POA: Diagnosis not present

## 2022-10-09 DIAGNOSIS — I25118 Atherosclerotic heart disease of native coronary artery with other forms of angina pectoris: Secondary | ICD-10-CM | POA: Diagnosis not present

## 2022-10-09 DIAGNOSIS — I501 Left ventricular failure: Secondary | ICD-10-CM | POA: Diagnosis not present

## 2023-07-07 DIAGNOSIS — E039 Hypothyroidism, unspecified: Secondary | ICD-10-CM | POA: Diagnosis not present

## 2023-07-07 DIAGNOSIS — I10 Essential (primary) hypertension: Secondary | ICD-10-CM | POA: Diagnosis not present

## 2023-07-07 DIAGNOSIS — E785 Hyperlipidemia, unspecified: Secondary | ICD-10-CM | POA: Diagnosis not present

## 2023-07-08 LAB — LAB REPORT - SCANNED: EGFR: 71

## 2023-07-09 DIAGNOSIS — I255 Ischemic cardiomyopathy: Secondary | ICD-10-CM | POA: Diagnosis not present

## 2023-07-09 DIAGNOSIS — I5022 Chronic systolic (congestive) heart failure: Secondary | ICD-10-CM | POA: Diagnosis not present

## 2023-07-09 DIAGNOSIS — I10 Essential (primary) hypertension: Secondary | ICD-10-CM | POA: Diagnosis not present

## 2023-07-09 DIAGNOSIS — I25118 Atherosclerotic heart disease of native coronary artery with other forms of angina pectoris: Secondary | ICD-10-CM | POA: Diagnosis not present

## 2023-10-03 DIAGNOSIS — I25118 Atherosclerotic heart disease of native coronary artery with other forms of angina pectoris: Secondary | ICD-10-CM | POA: Diagnosis not present

## 2023-10-03 DIAGNOSIS — I10 Essential (primary) hypertension: Secondary | ICD-10-CM | POA: Diagnosis not present

## 2023-10-03 DIAGNOSIS — I255 Ischemic cardiomyopathy: Secondary | ICD-10-CM | POA: Diagnosis not present

## 2023-10-03 DIAGNOSIS — I5022 Chronic systolic (congestive) heart failure: Secondary | ICD-10-CM | POA: Diagnosis not present

## 2024-01-14 DIAGNOSIS — I25118 Atherosclerotic heart disease of native coronary artery with other forms of angina pectoris: Secondary | ICD-10-CM | POA: Diagnosis not present

## 2024-01-14 DIAGNOSIS — Z8679 Personal history of other diseases of the circulatory system: Secondary | ICD-10-CM | POA: Diagnosis not present

## 2024-01-14 DIAGNOSIS — I5022 Chronic systolic (congestive) heart failure: Secondary | ICD-10-CM | POA: Diagnosis not present

## 2024-01-14 DIAGNOSIS — I10 Essential (primary) hypertension: Secondary | ICD-10-CM | POA: Diagnosis not present

## 2024-04-14 DIAGNOSIS — Z8679 Personal history of other diseases of the circulatory system: Secondary | ICD-10-CM | POA: Diagnosis not present

## 2024-04-14 DIAGNOSIS — I10 Essential (primary) hypertension: Secondary | ICD-10-CM | POA: Diagnosis not present

## 2024-04-14 DIAGNOSIS — I5022 Chronic systolic (congestive) heart failure: Secondary | ICD-10-CM | POA: Diagnosis not present

## 2024-04-14 DIAGNOSIS — I25118 Atherosclerotic heart disease of native coronary artery with other forms of angina pectoris: Secondary | ICD-10-CM | POA: Diagnosis not present

## 2024-07-12 ENCOUNTER — Ambulatory Visit

## 2024-07-12 VITALS — BP 125/88 | HR 75 | Ht 70.0 in | Wt 144.8 lb

## 2024-07-12 DIAGNOSIS — I252 Old myocardial infarction: Secondary | ICD-10-CM

## 2024-07-12 DIAGNOSIS — T148XXA Other injury of unspecified body region, initial encounter: Secondary | ICD-10-CM

## 2024-07-12 DIAGNOSIS — N6342 Unspecified lump in left breast, subareolar: Secondary | ICD-10-CM | POA: Diagnosis not present

## 2024-07-12 DIAGNOSIS — Z23 Encounter for immunization: Secondary | ICD-10-CM | POA: Diagnosis not present

## 2024-07-12 DIAGNOSIS — Z7901 Long term (current) use of anticoagulants: Secondary | ICD-10-CM

## 2024-07-12 DIAGNOSIS — E785 Hyperlipidemia, unspecified: Secondary | ICD-10-CM

## 2024-07-12 DIAGNOSIS — Z5181 Encounter for therapeutic drug level monitoring: Secondary | ICD-10-CM | POA: Diagnosis not present

## 2024-07-12 DIAGNOSIS — I4891 Unspecified atrial fibrillation: Secondary | ICD-10-CM | POA: Insufficient documentation

## 2024-07-12 DIAGNOSIS — I509 Heart failure, unspecified: Secondary | ICD-10-CM | POA: Diagnosis not present

## 2024-07-12 DIAGNOSIS — I10 Essential (primary) hypertension: Secondary | ICD-10-CM | POA: Diagnosis not present

## 2024-07-12 DIAGNOSIS — K219 Gastro-esophageal reflux disease without esophagitis: Secondary | ICD-10-CM

## 2024-07-12 DIAGNOSIS — F319 Bipolar disorder, unspecified: Secondary | ICD-10-CM | POA: Insufficient documentation

## 2024-07-12 NOTE — Assessment & Plan Note (Signed)
 Patient reported unusual bruises 2 months ago after bumping his chest, resolved in normal timeframe. CBC and CMP today

## 2024-07-12 NOTE — Progress Notes (Signed)
 SUBJECTIVE:   CHIEF COMPLAINT / HPI:   Patient presents to establish care today.  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  His daughter who lives in [place] is getting married next March and he would like to be healthy and mobile for that event.  He feels that he is still doing well, does light weight lifting and can perform all of his ADLs independently.  He lives by himself here in [place] and can comfortably provide for himself.  Discussed his leisure activities including gardening and working outside.  Discussed his medical history primarily managed by his cardiologist Dr. Levern.  Reviewed his medications and intake paperwork with the patient.  He sees Dr. Levern on Wednesday of this week, and denies needing any current refills of his medications.  Patient reports 2 months ago bumping his left chest while working outside.  He noticed a large bruise developing than he expected and he had tenderness the area, with which resolved over the next week or so.  He now has a firm lump underneath the nipple on that side which is not present on the right.  He understands it is likely due to a benign process, he is still concerned by the bruising and the lump is curious what we can do some kind of testing.  Discussed CBC in the office today and return to clinic in 1 month for possible ultrasound if the lump is still there or getting bigger.  He denies fevers, weight loss, night sweats.  PERTINENT  PMH / PSH:   OBJECTIVE:   BP 125/88   Pulse 75   Ht 5' 10 (1.778 m)   Wt 144 lb 12.8 oz (65.7 kg)   SpO2 99%   BMI 20.78 kg/m   General: Awake, alert, NAD. Communicates clearly. HEENT: NCAT. PERRLA EOMI. Anicteric sclera, conjunctiva clear. Dentition is poor. TM intact w/o effusion, EAC patent. MMM, clear OP w/ no exudates or erythema.  Cardio: RRR. Normal S1, S2. No murmur, rub,  gallop appreciated. 2+ radial and dorsalis pedis pulses b/l w/ good capillary refill. No LE edema.  Resp: CTA bilaterally. No wheezes, rales, or rhonchi. Normal work of breathing on room air.  Abdomen: soft, non-tender, non-distended. Normoactive BS auscultated. No guarding, rigidity, or rebound.  MSK: Full ROM w/ no TTP or obvious deformity in b/l UE and LE. No swelling or sign of injury. Normal gait.  Chest: Firm L subareolar lump that appears ~0.5cm x 0.5cm. Non tender, no overlying erythema or skin changes.   Skin: No rash or lesions appreciated. No abnormal nevi.  Neuro: AOx4. Bilateral UE and LE strength 5/5. Sensation to light touch intact and symmetrical bilaterally. No focal deficits.  Psych: Appropriate mood and affect. No SI/HI.    ASSESSMENT/PLAN:   Assessment & Plan Atrial fibrillation, unspecified type (HCC) Monitoring for long-term anticoagulant use Bruising Patient reported unusual bruises 2 months ago after bumping his chest, resolved in normal timeframe. CBC and CMP today Encounter for immunization Flu shot administered in the clinic today Patient agreed to get shingles and tetanus shot at the pharmacy Subareolar mass of left breast Patient noticed a lump/firmness under his left nipple after bumping it 2 months ago. Initial differential is large and includes lipoma, fat necrosis, unilateral gynecomastia from spironolactone. To clinic in 1 month and can consider ultrasound of the lump if it remains. Precautions provided regarding increase in size or pain of  the lump in the interim.    Leafy Scriver, DO [facility]

## 2024-07-13 ENCOUNTER — Ambulatory Visit: Payer: Self-pay

## 2024-07-13 ENCOUNTER — Telehealth: Payer: Self-pay

## 2024-07-13 LAB — CBC WITH DIFFERENTIAL/PLATELET
Basophils Absolute: 0.1 x10E3/uL (ref 0.0–0.2)
Basos: 2 %
EOS (ABSOLUTE): 0.2 x10E3/uL (ref 0.0–0.4)
Eos: 3 %
Hematocrit: 48.7 % (ref 37.5–51.0)
Hemoglobin: 16.1 g/dL (ref 13.0–17.7)
Immature Grans (Abs): 0 x10E3/uL (ref 0.0–0.1)
Immature Granulocytes: 0 %
Lymphocytes Absolute: 1.5 x10E3/uL (ref 0.7–3.1)
Lymphs: 20 %
MCH: 31.5 pg (ref 26.6–33.0)
MCHC: 33.1 g/dL (ref 31.5–35.7)
MCV: 95 fL (ref 79–97)
Monocytes Absolute: 0.8 x10E3/uL (ref 0.1–0.9)
Monocytes: 10 %
Neutrophils Absolute: 4.7 x10E3/uL (ref 1.4–7.0)
Neutrophils: 65 %
Platelets: 180 x10E3/uL (ref 150–450)
RBC: 5.11 x10E6/uL (ref 4.14–5.80)
RDW: 13.4 % (ref 11.6–15.4)
WBC: 7.2 x10E3/uL (ref 3.4–10.8)

## 2024-07-13 LAB — COMPREHENSIVE METABOLIC PANEL WITH GFR
ALT: 15 IU/L (ref 0–44)
AST: 22 IU/L (ref 0–40)
Albumin: 4 g/dL (ref 3.8–4.8)
Alkaline Phosphatase: 86 IU/L (ref 47–123)
BUN/Creatinine Ratio: 15 (ref 10–24)
BUN: 17 mg/dL (ref 8–27)
Bilirubin Total: 0.7 mg/dL (ref 0.0–1.2)
CO2: 20 mmol/L (ref 20–29)
Calcium: 9.5 mg/dL (ref 8.6–10.2)
Chloride: 105 mmol/L (ref 96–106)
Creatinine, Ser: 1.17 mg/dL (ref 0.76–1.27)
Globulin, Total: 3 g/dL (ref 1.5–4.5)
Glucose: 91 mg/dL (ref 70–99)
Potassium: 4.2 mmol/L (ref 3.5–5.2)
Sodium: 139 mmol/L (ref 134–144)
Total Protein: 7 g/dL (ref 6.0–8.5)
eGFR: 64 mL/min/1.73 (ref >59)

## 2024-07-13 NOTE — Telephone Encounter (Signed)
 Printed letter per Dr. Merced requested and placed in outgoing mail box.  Harlene Reiter, CMA

## 2024-07-16 NOTE — Telephone Encounter (Signed)
 Letter printed and mailed to patient by Glade Cave.  Cena JONELLE Pesa, CMA

## 2024-08-03 ENCOUNTER — Ambulatory Visit: Payer: Self-pay

## 2024-08-03 NOTE — Telephone Encounter (Signed)
 Letter was sent by PCP on 9/30.  Called patient and he reports he received letter.   FU apt with PCP scheduled for 10/29.

## 2024-08-03 NOTE — Telephone Encounter (Signed)
-----   Message from Camie Dixons sent at 08/03/2024  8:10 AM EDT ----- Please mail Mr. Clingerman a letter stating this lab result was normal. Thank you! ----- Message ----- From: Renaye Rollo RAMAN Sent: 07/30/2024  12:49 PM EDT To: Leafy Scriver, DO

## 2024-08-11 ENCOUNTER — Ambulatory Visit (INDEPENDENT_AMBULATORY_CARE_PROVIDER_SITE_OTHER): Payer: Self-pay

## 2024-08-11 VITALS — BP 129/92 | HR 75 | Ht 69.0 in | Wt 147.8 lb

## 2024-08-11 DIAGNOSIS — E039 Hypothyroidism, unspecified: Secondary | ICD-10-CM | POA: Diagnosis not present

## 2024-08-11 DIAGNOSIS — N6342 Unspecified lump in left breast, subareolar: Secondary | ICD-10-CM

## 2024-08-11 NOTE — Progress Notes (Signed)
    SUBJECTIVE:   CHIEF COMPLAINT / HPI:   Mr Frisbie presents today for f/u of labs and L breast lump. Reviewed his labs, they were all reassuring.   His lump under his L areola has regressed in size since last visit. He had his amlodipine and spironolactone decreased by his Cardiologist Dr Levern since our last visit.  He reports no pain and no major concerns at this time.  Could consider further imaging if the lump grows in size.  Overall he's doing well. He's been traveling to and from [place] to help take care of his wife who has Parkinson's disease frequently.   He reports he will get his pneumococcal and shingles vaccine at the pharmacy soon, he has just been busy recently.  He endorses he will let me know when that happens.  OBJECTIVE:   Ht 5' 9 (1.753 m)   Wt 147 lb 12.8 oz (67 kg)   BMI 21.83 kg/m   General: Awake, alert, NAD. Communicates clearly. Chest: No skin lesions or rashes. Small area of subareolar fullness w/o discrete nodule or cyst.  Neuro: A&O Respiratory: normal WOB on RA Extremities: Moving all 4 extremities equally   ASSESSMENT/PLAN:   Assessment & Plan Subareolar mass of left breast Patient's left subareolar breast lump subjectively decreasing in size.  This coincides with a decrease in his dose of spironolactone.  Patient comfortable with continued monitoring at this time. Hypothyroidism, unspecified type Takes levothyroxine chronically, TSH in clinic today     Leafy Scriver, DO [facility]

## 2024-08-12 LAB — TSH: TSH: 0.796 u[IU]/mL (ref 0.450–4.500)

## 2024-08-23 ENCOUNTER — Ambulatory Visit
